# Patient Record
Sex: Male | Born: 1940 | Race: White | Hispanic: No | Marital: Married | Smoking: Never smoker
Health system: Southern US, Community
[De-identification: ages and names within clinical notes are randomized; demographics above are authoritative.]

---

## 1998-02-24 ENCOUNTER — Other Ambulatory Visit: Admission: RE | Admit: 1998-02-24 | Discharge: 1998-02-24 | Payer: Self-pay | Admitting: Family Medicine

## 1998-10-06 ENCOUNTER — Ambulatory Visit (HOSPITAL_COMMUNITY): Admission: RE | Admit: 1998-10-06 | Discharge: 1998-10-06 | Payer: Self-pay | Admitting: Family Medicine

## 1998-10-06 ENCOUNTER — Encounter: Payer: Self-pay | Admitting: Family Medicine

## 2000-03-14 ENCOUNTER — Inpatient Hospital Stay (HOSPITAL_COMMUNITY): Admission: EM | Admit: 2000-03-14 | Discharge: 2000-03-17 | Payer: Self-pay | Admitting: Emergency Medicine

## 2000-03-14 ENCOUNTER — Encounter: Payer: Self-pay | Admitting: Emergency Medicine

## 2000-03-16 ENCOUNTER — Encounter: Payer: Self-pay | Admitting: Sports Medicine

## 2000-03-22 ENCOUNTER — Encounter: Admission: RE | Admit: 2000-03-22 | Discharge: 2000-03-22 | Payer: Self-pay | Admitting: Sports Medicine

## 2001-01-11 ENCOUNTER — Encounter: Payer: Self-pay | Admitting: Emergency Medicine

## 2001-01-11 ENCOUNTER — Emergency Department (HOSPITAL_COMMUNITY): Admission: EM | Admit: 2001-01-11 | Discharge: 2001-01-11 | Payer: Self-pay | Admitting: Emergency Medicine

## 2001-10-30 ENCOUNTER — Ambulatory Visit (HOSPITAL_COMMUNITY): Admission: RE | Admit: 2001-10-30 | Discharge: 2001-10-30 | Payer: Self-pay | Admitting: Gastroenterology

## 2002-05-17 ENCOUNTER — Encounter: Admission: RE | Admit: 2002-05-17 | Discharge: 2002-05-17 | Payer: Self-pay | Admitting: Family Medicine

## 2002-05-17 ENCOUNTER — Encounter: Payer: Self-pay | Admitting: Family Medicine

## 2003-08-06 ENCOUNTER — Ambulatory Visit (HOSPITAL_COMMUNITY): Admission: RE | Admit: 2003-08-06 | Discharge: 2003-08-06 | Payer: Self-pay

## 2005-05-10 ENCOUNTER — Ambulatory Visit: Payer: Self-pay | Admitting: Cardiology

## 2005-05-14 ENCOUNTER — Ambulatory Visit: Payer: Self-pay

## 2005-05-25 ENCOUNTER — Ambulatory Visit: Payer: Self-pay | Admitting: Cardiology

## 2005-06-02 ENCOUNTER — Encounter: Admission: RE | Admit: 2005-06-02 | Discharge: 2005-06-02 | Payer: Self-pay | Admitting: Otolaryngology

## 2005-11-11 ENCOUNTER — Ambulatory Visit (HOSPITAL_COMMUNITY): Admission: RE | Admit: 2005-11-11 | Discharge: 2005-11-11 | Payer: Self-pay | Admitting: Otolaryngology

## 2006-12-01 ENCOUNTER — Ambulatory Visit: Payer: Self-pay | Admitting: Cardiology

## 2006-12-06 ENCOUNTER — Ambulatory Visit: Payer: Self-pay

## 2006-12-06 ENCOUNTER — Encounter: Payer: Self-pay | Admitting: Cardiology

## 2008-05-27 ENCOUNTER — Encounter: Admission: RE | Admit: 2008-05-27 | Discharge: 2008-05-27 | Payer: Self-pay | Admitting: Rheumatology

## 2008-06-07 ENCOUNTER — Ambulatory Visit: Payer: Self-pay | Admitting: Oncology

## 2008-06-12 LAB — CBC WITH DIFFERENTIAL/PLATELET
Basophils Absolute: 0 10*3/uL (ref 0.0–0.1)
EOS%: 17.9 % — ABNORMAL HIGH (ref 0.0–7.0)
HCT: 45.3 % (ref 38.7–49.9)
HGB: 15.7 g/dL (ref 13.0–17.1)
LYMPH%: 16.6 % (ref 14.0–48.0)
MCH: 31.5 pg (ref 28.0–33.4)
MCV: 91.1 fL (ref 81.6–98.0)
MONO#: 0.6 10*3/uL (ref 0.1–0.9)
MONO%: 7.2 % (ref 0.0–13.0)
NEUT%: 57.9 % (ref 40.0–75.0)
Platelets: 145 10*3/uL (ref 145–400)
RBC: 4.98 10*6/uL (ref 4.20–5.71)
RDW: 14.6 % (ref 11.2–14.6)

## 2008-06-12 LAB — LACTATE DEHYDROGENASE: LDH: 216 U/L (ref 94–250)

## 2008-06-12 LAB — COMPREHENSIVE METABOLIC PANEL
ALT: 15 U/L (ref 0–53)
Albumin: 4.2 g/dL (ref 3.5–5.2)
Alkaline Phosphatase: 51 U/L (ref 39–117)
Calcium: 9 mg/dL (ref 8.4–10.5)
Creatinine, Ser: 0.81 mg/dL (ref 0.40–1.50)
Sodium: 139 mEq/L (ref 135–145)
Total Bilirubin: 0.6 mg/dL (ref 0.3–1.2)
Total Protein: 7.3 g/dL (ref 6.0–8.3)

## 2008-09-05 ENCOUNTER — Ambulatory Visit: Payer: Self-pay | Admitting: Oncology

## 2008-09-09 ENCOUNTER — Ambulatory Visit (HOSPITAL_COMMUNITY): Admission: RE | Admit: 2008-09-09 | Discharge: 2008-09-09 | Payer: Self-pay | Admitting: Oncology

## 2008-09-09 LAB — CBC WITH DIFFERENTIAL/PLATELET
BASO%: 0.5 % (ref 0.0–2.0)
LYMPH%: 15.2 % (ref 14.0–48.0)
MCV: 92.9 fL (ref 81.6–98.0)
MONO%: 6.3 % (ref 0.0–13.0)
RBC: 5.05 10*6/uL (ref 4.20–5.71)
RDW: 14.7 % — ABNORMAL HIGH (ref 11.2–14.6)
lymph#: 1.2 10*3/uL (ref 0.9–3.3)

## 2008-09-09 LAB — COMPREHENSIVE METABOLIC PANEL
ALT: 22 U/L (ref 0–53)
AST: 22 U/L (ref 0–37)
Albumin: 3.9 g/dL (ref 3.5–5.2)
Alkaline Phosphatase: 54 U/L (ref 39–117)
Calcium: 9 mg/dL (ref 8.4–10.5)
Chloride: 105 mEq/L (ref 96–112)
Glucose, Bld: 102 mg/dL — ABNORMAL HIGH (ref 70–99)
Sodium: 140 mEq/L (ref 135–145)
Total Bilirubin: 0.9 mg/dL (ref 0.3–1.2)
Total Protein: 7.4 g/dL (ref 6.0–8.3)

## 2008-11-28 ENCOUNTER — Ambulatory Visit: Payer: Self-pay | Admitting: Cardiology

## 2008-12-09 ENCOUNTER — Ambulatory Visit: Payer: Self-pay

## 2008-12-09 ENCOUNTER — Encounter: Payer: Self-pay | Admitting: Cardiology

## 2009-03-06 ENCOUNTER — Ambulatory Visit: Payer: Self-pay | Admitting: Oncology

## 2009-03-10 ENCOUNTER — Ambulatory Visit (HOSPITAL_COMMUNITY): Admission: RE | Admit: 2009-03-10 | Discharge: 2009-03-10 | Payer: Self-pay | Admitting: Oncology

## 2009-03-11 ENCOUNTER — Encounter (HOSPITAL_COMMUNITY): Admission: RE | Admit: 2009-03-11 | Discharge: 2009-05-07 | Payer: Self-pay | Admitting: Rheumatology

## 2009-03-13 ENCOUNTER — Encounter: Payer: Self-pay | Admitting: Cardiology

## 2009-09-10 ENCOUNTER — Ambulatory Visit: Payer: Self-pay | Admitting: Oncology

## 2009-09-24 ENCOUNTER — Encounter (INDEPENDENT_AMBULATORY_CARE_PROVIDER_SITE_OTHER): Payer: Self-pay | Admitting: *Deleted

## 2009-11-01 IMAGING — PT NM PET TUM IMG SKULL BASE T - THIGH
8 series · 25 of 25 positions shown · non-contrast
Comparison: CTs of the chest, abdomen and pelvis 05/27/2008.

CLINICAL DATA: Staging lymphoma.

NUCLEAR MEDICINE FDG PET CT TUMOR IMAGING- (SKULL BASE THROUGH
THIGHS)
TECHNIQUE: 17.4 mCi F-18 FDG was injected intravenously via the
right wrist.  Full-ring PET imaging was performed from the skull
base through the mid-thighs 61  minutes after injection.  CT data
was obtained and used for attenuation correction and anatomic
localization only.  (This was not acquired as a diagnostic CT
examination.)
Fasting Blood Glucose:  108.

[Series 1: pet ac · axial · 3.3mm · 4.69mm/px · z∈[-870,+0]mm · 5 of 267 slices shown]
[im 1/267]
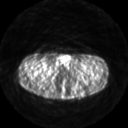
[im 67/267]
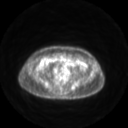
[im 134/267]
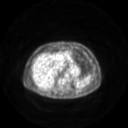
[im 200/267]
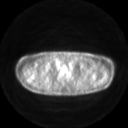
[im 267/267]
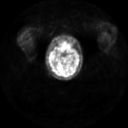

[Series 2: pet nac · axial · 3.3mm · 4.69mm/px · z∈[-870,+0]mm · 5 of 267 slices shown]
[im 1/267]
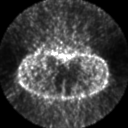
[im 67/267]
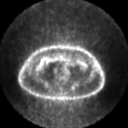
[im 134/267]
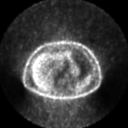
[im 200/267]
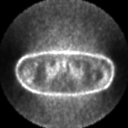
[im 267/267]
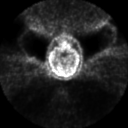

[Series 2: ct images · axial · 3.8mm · 0.98mm/px · z∈[-870,+0]mm · 5 of 267 slices shown]
[im 1/267]
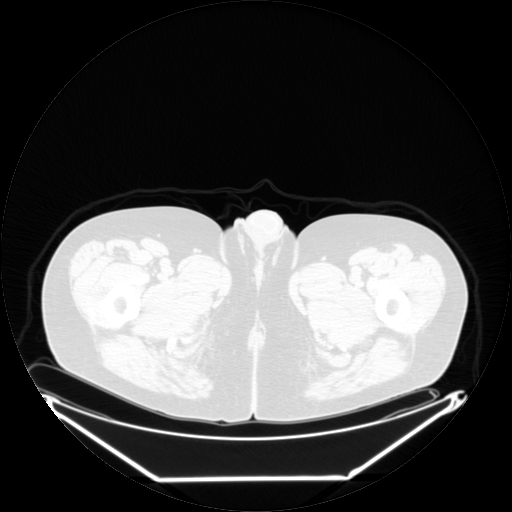
[im 67/267]
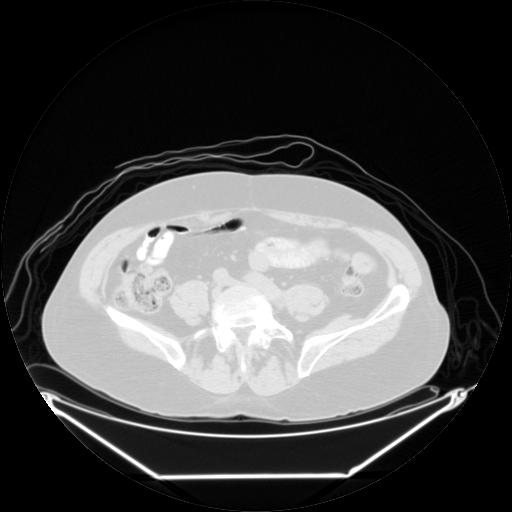
[im 134/267]
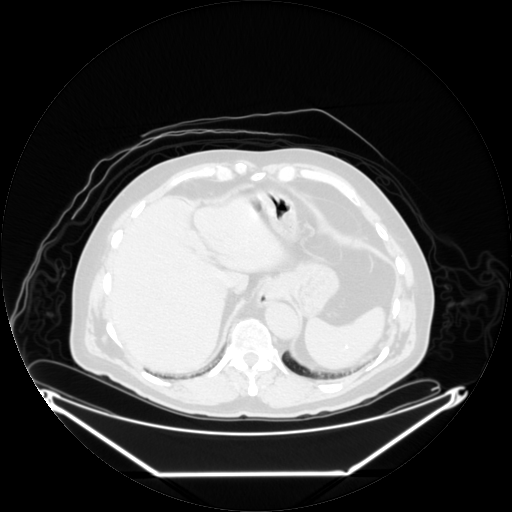
[im 200/267]
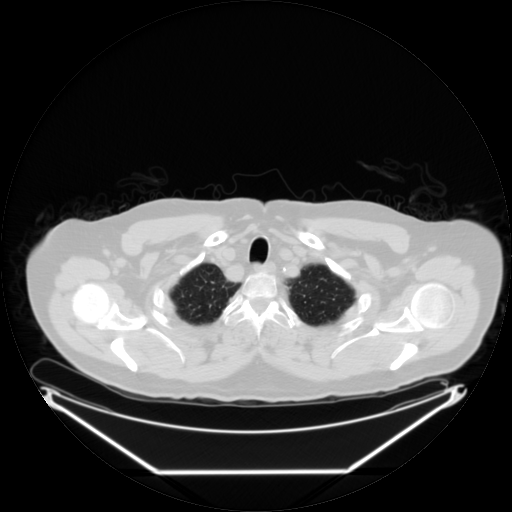
[im 267/267  brain]
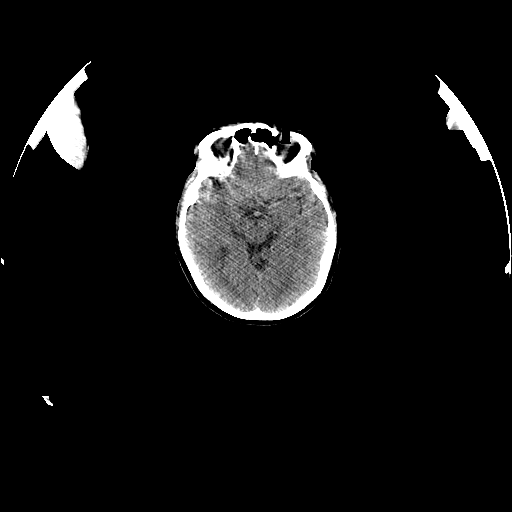

[Series 123: mip · coronal · 3.3mm · 4.69mm/px · 1 of 30 slices shown]
[im 1/30]
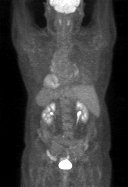

[Series 150: reformatted · axial · 3.3mm · 1.02mm/px · 1 of 8 slices shown (1 of 4)]
[im 1/8]
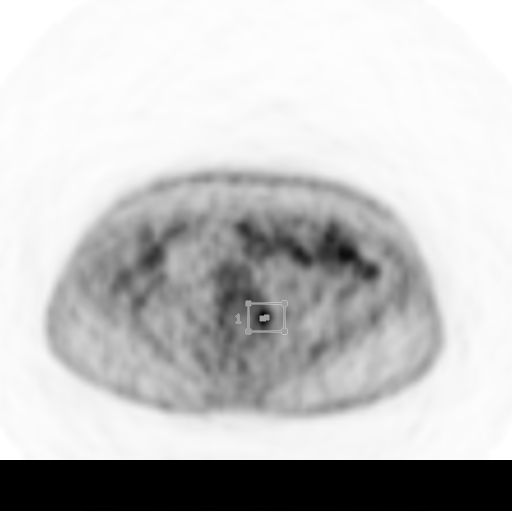

[Series 151: reformatted · axial · 3.3mm · 3.91mm/px · z∈[-870,+0]mm · 5 of 265 slices shown (2 of 4)]
[im 1/265]
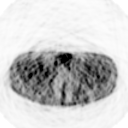
[im 67/265]
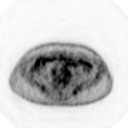
[im 133/265]
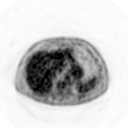
[im 199/265]
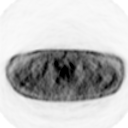
[im 265/265]
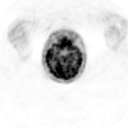

[Series 153: reformatted · coronal · 4.7mm · 6.98mm/px · 2 of 73 slices shown (3 of 4)]
[im 1/73]
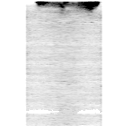
[im 73/73]
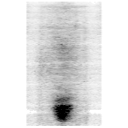

[Series 250: reformatted · axial · 3.8mm · 0.98mm/px · 1 of 4 slices shown (4 of 4)]
[im 1/4]
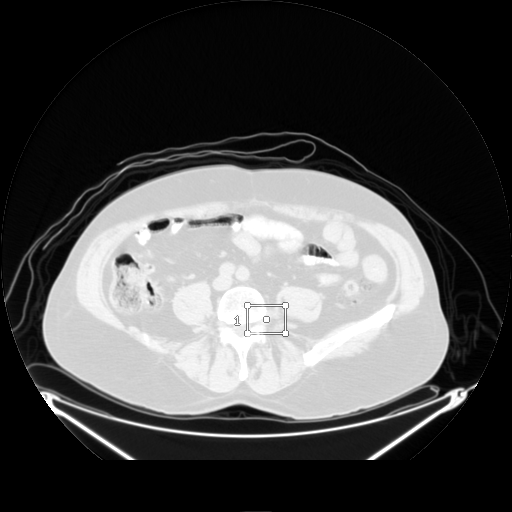

[25 of 25 positions shown; findings below may reference images not displayed]

FINDINGS: There is prominent symmetric palatine tonsil metabolic
activity bilaterally.  This has a maximal SUV of 5.3.  On the CT
images, there are small calcifications within the palatine tonsils
bilaterally.  No dominant soft tissue mass is identified.  No other
areas of hypermetabolic activity are seen within the pharyngeal
mucosal space.  There is no hypermetabolic nodal activity in the
neck.

There is no hypermetabolic activity within the precarinal lymph
node described on the prior examination.  This has a fatty hilum on
today's CT images.  The small hilar lymph nodes bilaterally are
mildly hypermetabolic with a maximal SUV of 4.2 on the left and
on the right.  There is no abnormal pulmonary parenchymal activity.

No hypermetabolic nodal activity is identified within the abdomen
or pelvis.  However, there is focal left paraspinal activity within
the left foramen at L4-L5.  This has a maximal SUV of 3.7.  This
corresponds with soft tissue in the foramen which appears new
compared with the prior CT.  This finding suggests a possible disc
extrusion with irritation of the adjacent nerve root accounting for
the hypermetabolic activity.  Atypical lymphoma cannot be excluded.

There is no hypermetabolic activity within the liver or spleen.
The CT images again demonstrate multiple calcified granulomas in
the liver and spleen and bilateral renal cysts.  The biapical lung
scarring appears stable.
IMPRESSION: 1.  Palatine tonsil activity is bilaterally symmetric and may be
related to chronic inflammation.  Correlate clinically.  There is
no hypermetabolic cervical adenopathy.
2.  Low-level activity in mildly prominent hilar lymph nodes
bilaterally is nonspecific and may be post inflammatory or related
to the patient's lymphoma.  There is no hypermetabolic mediastinal
or abdominal lymphadenopathy.
3.  Small focus of hypermetabolic activity in the left L4-L5
foramen may reflect a disc extrusion with left L4 nerve root
irritation.  Left paraspinal tumor is felt to be unlikely, but not
completely excluded.  Does the patient have a left L4
radiculopathy?  This finding would be best evaluated with lumbar
MRI to include postcontrast images.

## 2011-02-16 LAB — DIFFERENTIAL
Basophils Relative: 0 % (ref 0–1)
Eosinophils Absolute: 0.5 10*3/uL (ref 0.0–0.7)
Neutro Abs: 10.5 10*3/uL — ABNORMAL HIGH (ref 1.7–7.7)

## 2011-02-16 LAB — COMPREHENSIVE METABOLIC PANEL
ALT: 17 U/L (ref 0–53)
AST: 20 U/L (ref 0–37)
Alkaline Phosphatase: 48 U/L (ref 39–117)
Calcium: 8.8 mg/dL (ref 8.4–10.5)
Creatinine, Ser: 0.96 mg/dL (ref 0.4–1.5)
Glucose, Bld: 167 mg/dL — ABNORMAL HIGH (ref 70–99)
Total Bilirubin: 0.8 mg/dL (ref 0.3–1.2)

## 2011-02-16 LAB — CBC
HCT: 44.4 % (ref 39.0–52.0)
Hemoglobin: 14.8 g/dL (ref 13.0–17.0)
MCHC: 33.4 g/dL (ref 30.0–36.0)
MCV: 93 fL (ref 78.0–100.0)
RDW: 13.9 % (ref 11.5–15.5)
WBC: 12.2 10*3/uL — ABNORMAL HIGH (ref 4.0–10.5)

## 2011-03-23 NOTE — Assessment & Plan Note (Signed)
[facility]                            CARDIOLOGY OFFICE NOTE   ROSCOE, WITTS                        MRN:          [id number]  DATE:11/28/2008                            DOB:          12-22-1940    Mr. Lindholm is seen for cardiac followup.  He has not been having any  chest pain or shortness of breath.  He goes about full activities.  He  does have some mild AI and mild dilatation of the aortic root and a 2-D  echo was done in January 2008 showing that these were stable.  He  follows very carefully with Dr. Kellie Simmering.   PAST MEDICAL HISTORY:   ALLERGIES:  No known drug allergies.   MEDICATIONS:  Norvasc 10, lisinopril, hydrochlorothiazide, aspirin,  Nexium, methotrexate, Paxil, Plaquenil, and prednisone.   OTHER MEDICAL PROBLEMS:  See the list below.   REVIEW OF SYSTEMS:  The patient mentions that he had some type of  abnormalities seen on the chest x-ray that [facility] by Dr. Kellie Simmering.  He says he is to have followup in May.  I do not have any further specifics.  The patient is not having any GI  or GU symptoms.  He is not having any headaches or fevers, chills, or  rashes.  Otherwise, his review of systems is negative.   PHYSICAL EXAMINATION:  VITAL SIGNS:  Blood pressure 158/94 with a pulse  of 64.  GENERAL:  The patient is oriented to person, time, and place.  Affect is  normal.  HEENT:  No xanthelasma.  He has normal extraocular motion.  NECK:  There are no carotid bruits.  There is no jugular venous  distention.  LUNGS:  Clear.  Respiratory effort is not labored.  CARDIAC:  An S1 with an S2.  There are no clicks or significant murmurs.  ABDOMEN:  Soft.  EXTREMITIES:  There is no peripheral edema.  I do not hear his AI or MR  today.   EKG today shows sinus rhythm with no significant changes.  He has  nonspecific ST-T-wave changes.   PROBLEMS:  1. Status post appendectomy and surgery for perforated  diverticulum in      1998.  2. Significant rheumatoid arthritis treated long-term by Dr. Kellie Simmering.  3. History of some depression, stable.  4. Benign prostatic hypertrophy.  5. Gastroesophageal reflux disease.  6. Hypertension.  His blood pressure is elevated today.  I have      reviewed this with him.  He tells me that at Dr. Ines Bloomer office,      he has been under control on a regular basis.  We will not change      his meds he is currently on meds.  Dr. Kellie Simmering will be checking his      pressures further.  7. Minimal coronary disease by cath in 2001.  8. Normal left ventricular function.  9. History of some shortness of breath over time.  10.Abnormality found on a chest x-ray that is being followed through      [facility]  [facility].  11.Mild aortic insufficiency.  12.Mild mitral regurgitation.  13.Mild dilatation of the aortic root.  It is now a 2-year interval      and we need to follow up 2-D echo to assess this further.  14.History of incomplete right bundle-branch block at times on his      EKG.   The patient is stable.  He does need a followup echo.  This is being  arranged.  I will be in touch with him about the echo after we obtain  it.  We will see him back in a year unless there is a change on his  echo.     Luis Abed, MD, [facility]  Electronically Signed    JDK/MedQ  DD: 11/28/2008  DT: 11/29/2008  Job #: [id number]   cc:   Aundra Dubin, M.D.

## 2011-08-10 LAB — GLUCOSE, CAPILLARY: Glucose-Capillary: 108 — ABNORMAL HIGH

## 2016-06-02 DIAGNOSIS — D696 Thrombocytopenia, unspecified: Secondary | ICD-10-CM

## 2016-12-27 DIAGNOSIS — M069 Rheumatoid arthritis, unspecified: Secondary | ICD-10-CM | POA: Diagnosis not present

## 2016-12-27 DIAGNOSIS — D721 Eosinophilia: Secondary | ICD-10-CM | POA: Diagnosis not present

## 2016-12-27 DIAGNOSIS — D696 Thrombocytopenia, unspecified: Secondary | ICD-10-CM | POA: Diagnosis not present

## 2018-03-23 DIAGNOSIS — M069 Rheumatoid arthritis, unspecified: Secondary | ICD-10-CM | POA: Diagnosis not present

## 2018-03-23 DIAGNOSIS — I10 Essential (primary) hypertension: Secondary | ICD-10-CM | POA: Diagnosis not present

## 2018-03-23 DIAGNOSIS — Z139 Encounter for screening, unspecified: Secondary | ICD-10-CM | POA: Diagnosis not present

## 2018-03-23 DIAGNOSIS — R9431 Abnormal electrocardiogram [ECG] [EKG]: Secondary | ICD-10-CM | POA: Diagnosis not present

## 2018-04-04 DIAGNOSIS — M0579 Rheumatoid arthritis with rheumatoid factor of multiple sites without organ or systems involvement: Secondary | ICD-10-CM | POA: Diagnosis not present

## 2018-04-04 DIAGNOSIS — Z79899 Other long term (current) drug therapy: Secondary | ICD-10-CM | POA: Diagnosis not present

## 2018-05-08 DIAGNOSIS — M720 Palmar fascial fibromatosis [Dupuytren]: Secondary | ICD-10-CM | POA: Diagnosis not present

## 2018-05-08 DIAGNOSIS — Z79899 Other long term (current) drug therapy: Secondary | ICD-10-CM | POA: Diagnosis not present

## 2018-05-08 DIAGNOSIS — M0579 Rheumatoid arthritis with rheumatoid factor of multiple sites without organ or systems involvement: Secondary | ICD-10-CM | POA: Diagnosis not present

## 2018-05-08 DIAGNOSIS — N343 Urethral syndrome, unspecified: Secondary | ICD-10-CM | POA: Diagnosis not present

## 2018-05-29 DIAGNOSIS — N481 Balanitis: Secondary | ICD-10-CM | POA: Diagnosis not present

## 2018-05-29 DIAGNOSIS — N471 Phimosis: Secondary | ICD-10-CM | POA: Diagnosis not present

## 2018-05-29 DIAGNOSIS — Z6823 Body mass index (BMI) 23.0-23.9, adult: Secondary | ICD-10-CM | POA: Diagnosis not present

## 2018-05-29 DIAGNOSIS — R7303 Prediabetes: Secondary | ICD-10-CM | POA: Diagnosis not present

## 2018-07-24 DIAGNOSIS — M720 Palmar fascial fibromatosis [Dupuytren]: Secondary | ICD-10-CM | POA: Diagnosis not present

## 2018-07-28 DIAGNOSIS — I10 Essential (primary) hypertension: Secondary | ICD-10-CM | POA: Diagnosis not present

## 2018-07-28 DIAGNOSIS — D696 Thrombocytopenia, unspecified: Secondary | ICD-10-CM | POA: Diagnosis not present

## 2018-07-28 DIAGNOSIS — Z9181 History of falling: Secondary | ICD-10-CM | POA: Diagnosis not present

## 2018-07-28 DIAGNOSIS — I2510 Atherosclerotic heart disease of native coronary artery without angina pectoris: Secondary | ICD-10-CM | POA: Diagnosis not present

## 2018-08-31 ENCOUNTER — Other Ambulatory Visit: Payer: Self-pay

## 2018-08-31 NOTE — Patient Outreach (Signed)
[facility]) Care Management  08/31/2018  Jackson Lawson February 26, 1941 [id number]   Medication Adherence call to [name] left a message for patient to call back patient is due on Lisinopril / HCTZ 20/25 mg. [name] is showing past due under [employer].   Lillia Abed CPhT Pharmacy Technician [facility] Care Management Direct Dial [phone]  Fax [phone] [email or web address]

## 2019-01-17 DIAGNOSIS — Z79899 Other long term (current) drug therapy: Secondary | ICD-10-CM | POA: Diagnosis not present

## 2019-01-17 DIAGNOSIS — M720 Palmar fascial fibromatosis [Dupuytren]: Secondary | ICD-10-CM | POA: Diagnosis not present

## 2019-01-17 DIAGNOSIS — M0579 Rheumatoid arthritis with rheumatoid factor of multiple sites without organ or systems involvement: Secondary | ICD-10-CM | POA: Diagnosis not present

## 2019-01-17 DIAGNOSIS — D696 Thrombocytopenia, unspecified: Secondary | ICD-10-CM | POA: Diagnosis not present

## 2019-01-17 DIAGNOSIS — M059 Rheumatoid arthritis with rheumatoid factor, unspecified: Secondary | ICD-10-CM | POA: Diagnosis not present

## 2019-02-02 DIAGNOSIS — I2510 Atherosclerotic heart disease of native coronary artery without angina pectoris: Secondary | ICD-10-CM | POA: Diagnosis not present

## 2019-02-02 DIAGNOSIS — M069 Rheumatoid arthritis, unspecified: Secondary | ICD-10-CM | POA: Diagnosis not present

## 2019-02-02 DIAGNOSIS — I10 Essential (primary) hypertension: Secondary | ICD-10-CM | POA: Diagnosis not present

## 2019-02-02 DIAGNOSIS — D696 Thrombocytopenia, unspecified: Secondary | ICD-10-CM | POA: Diagnosis not present

## 2019-04-19 DIAGNOSIS — M0579 Rheumatoid arthritis with rheumatoid factor of multiple sites without organ or systems involvement: Secondary | ICD-10-CM | POA: Diagnosis not present

## 2019-04-19 DIAGNOSIS — Z79899 Other long term (current) drug therapy: Secondary | ICD-10-CM | POA: Diagnosis not present

## 2019-06-12 DIAGNOSIS — I10 Essential (primary) hypertension: Secondary | ICD-10-CM | POA: Diagnosis not present

## 2019-06-12 DIAGNOSIS — I2510 Atherosclerotic heart disease of native coronary artery without angina pectoris: Secondary | ICD-10-CM | POA: Diagnosis not present

## 2019-06-12 DIAGNOSIS — Z139 Encounter for screening, unspecified: Secondary | ICD-10-CM | POA: Diagnosis not present

## 2019-06-12 DIAGNOSIS — D696 Thrombocytopenia, unspecified: Secondary | ICD-10-CM | POA: Diagnosis not present

## 2019-06-12 DIAGNOSIS — M720 Palmar fascial fibromatosis [Dupuytren]: Secondary | ICD-10-CM | POA: Diagnosis not present

## 2019-06-25 DIAGNOSIS — Z0000 Encounter for general adult medical examination without abnormal findings: Secondary | ICD-10-CM | POA: Diagnosis not present

## 2019-06-25 DIAGNOSIS — E785 Hyperlipidemia, unspecified: Secondary | ICD-10-CM | POA: Diagnosis not present

## 2019-06-25 DIAGNOSIS — Z9181 History of falling: Secondary | ICD-10-CM | POA: Diagnosis not present

## 2019-07-20 DIAGNOSIS — Z79899 Other long term (current) drug therapy: Secondary | ICD-10-CM | POA: Diagnosis not present

## 2019-07-20 DIAGNOSIS — M0579 Rheumatoid arthritis with rheumatoid factor of multiple sites without organ or systems involvement: Secondary | ICD-10-CM | POA: Diagnosis not present

## 2019-07-20 DIAGNOSIS — M720 Palmar fascial fibromatosis [Dupuytren]: Secondary | ICD-10-CM | POA: Diagnosis not present

## 2019-08-13 DIAGNOSIS — Z23 Encounter for immunization: Secondary | ICD-10-CM | POA: Diagnosis not present

## 2019-08-13 DIAGNOSIS — R05 Cough: Secondary | ICD-10-CM | POA: Diagnosis not present

## 2019-08-13 DIAGNOSIS — I10 Essential (primary) hypertension: Secondary | ICD-10-CM | POA: Diagnosis not present

## 2019-08-13 DIAGNOSIS — Z139 Encounter for screening, unspecified: Secondary | ICD-10-CM | POA: Diagnosis not present

## 2019-08-13 DIAGNOSIS — K219 Gastro-esophageal reflux disease without esophagitis: Secondary | ICD-10-CM | POA: Diagnosis not present

## 2019-08-13 DIAGNOSIS — I2510 Atherosclerotic heart disease of native coronary artery without angina pectoris: Secondary | ICD-10-CM | POA: Diagnosis not present

## 2019-08-15 DIAGNOSIS — Z6822 Body mass index (BMI) 22.0-22.9, adult: Secondary | ICD-10-CM | POA: Diagnosis not present

## 2019-08-15 DIAGNOSIS — I959 Hypotension, unspecified: Secondary | ICD-10-CM | POA: Diagnosis not present

## 2019-08-15 DIAGNOSIS — N390 Urinary tract infection, site not specified: Secondary | ICD-10-CM | POA: Diagnosis not present

## 2019-08-15 DIAGNOSIS — I10 Essential (primary) hypertension: Secondary | ICD-10-CM | POA: Diagnosis not present

## 2019-08-15 DIAGNOSIS — I2510 Atherosclerotic heart disease of native coronary artery without angina pectoris: Secondary | ICD-10-CM | POA: Diagnosis not present

## 2019-08-16 DIAGNOSIS — N390 Urinary tract infection, site not specified: Secondary | ICD-10-CM | POA: Diagnosis not present

## 2019-08-22 DIAGNOSIS — I2510 Atherosclerotic heart disease of native coronary artery without angina pectoris: Secondary | ICD-10-CM | POA: Diagnosis not present

## 2019-08-22 DIAGNOSIS — N390 Urinary tract infection, site not specified: Secondary | ICD-10-CM | POA: Diagnosis not present

## 2019-08-22 DIAGNOSIS — R001 Bradycardia, unspecified: Secondary | ICD-10-CM | POA: Diagnosis not present

## 2019-08-22 DIAGNOSIS — R05 Cough: Secondary | ICD-10-CM | POA: Diagnosis not present

## 2019-08-22 DIAGNOSIS — I10 Essential (primary) hypertension: Secondary | ICD-10-CM | POA: Diagnosis not present

## 2019-09-05 DIAGNOSIS — D72829 Elevated white blood cell count, unspecified: Secondary | ICD-10-CM | POA: Diagnosis not present

## 2019-09-05 DIAGNOSIS — R001 Bradycardia, unspecified: Secondary | ICD-10-CM | POA: Diagnosis not present

## 2019-09-05 DIAGNOSIS — N289 Disorder of kidney and ureter, unspecified: Secondary | ICD-10-CM | POA: Diagnosis not present

## 2019-09-05 DIAGNOSIS — R05 Cough: Secondary | ICD-10-CM | POA: Diagnosis not present

## 2019-09-05 DIAGNOSIS — I10 Essential (primary) hypertension: Secondary | ICD-10-CM | POA: Diagnosis not present

## 2019-09-12 DIAGNOSIS — Z6821 Body mass index (BMI) 21.0-21.9, adult: Secondary | ICD-10-CM | POA: Diagnosis not present

## 2019-09-12 DIAGNOSIS — N390 Urinary tract infection, site not specified: Secondary | ICD-10-CM | POA: Diagnosis not present

## 2019-09-12 DIAGNOSIS — I959 Hypotension, unspecified: Secondary | ICD-10-CM | POA: Diagnosis not present

## 2019-09-12 DIAGNOSIS — I4891 Unspecified atrial fibrillation: Secondary | ICD-10-CM | POA: Diagnosis not present

## 2019-09-14 DIAGNOSIS — N390 Urinary tract infection, site not specified: Secondary | ICD-10-CM | POA: Diagnosis not present

## 2019-09-14 DIAGNOSIS — I10 Essential (primary) hypertension: Secondary | ICD-10-CM | POA: Diagnosis not present

## 2019-09-14 DIAGNOSIS — Z6822 Body mass index (BMI) 22.0-22.9, adult: Secondary | ICD-10-CM | POA: Diagnosis not present

## 2019-09-14 DIAGNOSIS — I4891 Unspecified atrial fibrillation: Secondary | ICD-10-CM | POA: Diagnosis not present

## 2019-09-19 DIAGNOSIS — N390 Urinary tract infection, site not specified: Secondary | ICD-10-CM | POA: Diagnosis not present

## 2019-09-19 DIAGNOSIS — Z6822 Body mass index (BMI) 22.0-22.9, adult: Secondary | ICD-10-CM | POA: Diagnosis not present

## 2019-09-19 DIAGNOSIS — I4891 Unspecified atrial fibrillation: Secondary | ICD-10-CM | POA: Diagnosis not present

## 2019-09-19 DIAGNOSIS — I10 Essential (primary) hypertension: Secondary | ICD-10-CM | POA: Diagnosis not present

## 2019-09-27 ENCOUNTER — Telehealth: Payer: Self-pay | Admitting: Cardiovascular Disease

## 2019-09-27 NOTE — Telephone Encounter (Signed)
New message:    Patient states he would like for his wife to be with him for appt she has to drive him and assist. Please call back.

## 2019-09-28 NOTE — Telephone Encounter (Signed)
Called and spoke to pt to let him know his wife can accompany him to appt 11/24. Verbalized understanding.

## 2019-10-02 ENCOUNTER — Other Ambulatory Visit: Payer: Self-pay

## 2019-10-02 ENCOUNTER — Encounter: Payer: Self-pay | Admitting: Cardiovascular Disease

## 2019-10-02 ENCOUNTER — Ambulatory Visit: Payer: Medicare Other | Admitting: Cardiovascular Disease

## 2019-10-02 VITALS — BP 114/75 | HR 75 | Temp 96.9°F | Ht 73.0 in | Wt 167.0 lb

## 2019-10-02 DIAGNOSIS — E785 Hyperlipidemia, unspecified: Secondary | ICD-10-CM | POA: Insufficient documentation

## 2019-10-02 DIAGNOSIS — I4891 Unspecified atrial fibrillation: Secondary | ICD-10-CM

## 2019-10-02 DIAGNOSIS — I351 Nonrheumatic aortic (valve) insufficiency: Secondary | ICD-10-CM | POA: Diagnosis not present

## 2019-10-02 DIAGNOSIS — I2510 Atherosclerotic heart disease of native coronary artery without angina pectoris: Secondary | ICD-10-CM

## 2019-10-02 DIAGNOSIS — I10 Essential (primary) hypertension: Secondary | ICD-10-CM | POA: Insufficient documentation

## 2019-10-02 DIAGNOSIS — I340 Nonrheumatic mitral (valve) insufficiency: Secondary | ICD-10-CM | POA: Insufficient documentation

## 2019-10-02 NOTE — Assessment & Plan Note (Signed)
History of hyperlipidemia not on statin therapy with lipid profile performed 01/28/2017 revealing total cholesterol of 186 with an LDL of 107.

## 2019-10-02 NOTE — Progress Notes (Signed)
10/02/2019 Jackson Lawson   1940-12-23  [id number]  Primary Physician Cyndi Bender, PA-C Primary Cardiologist: Lorretta Harp MD Lupe Carney, [place]  HPI:  Jackson Lawson is a 78 y.o. thin appearing married Caucasian male father of 3 daughters, grandfather of 6 grandchildren is accompanied by his wife Marcie Bal today. He was referred by Cyndi Bender, PA-C for evaluation of A. fib. He has seen Dr. Dola Argyle remotely in the 90s. He is retired from being a Brewing technologist. His risk factors include treated hypertension. There is a question of a heart attack back in 1995 with a heart cath performed 03/17/2000 that showed minimal CAD. He also has history of aortic insufficiency, mitral regurgitation and aortic root dilatation. He gets occasional atypical chest pain. He was seen by Cyndi Bender and on in the office 11/6 and found to be in A. fib and placed on Eliquis oral anticoagulation.   Current Meds  Medication Sig  . amLODipine (NORVASC) 10 MG tablet Take 10 mg by mouth daily.  [name] apixaban (ELIQUIS) 2.5 MG TABS tablet Take 2.5 mg by mouth 2 (two) times daily.  . Ascorbic Acid (VITAMIN C) 100 MG tablet Take by mouth.  [name] aspirin EC 81 MG tablet Take by mouth.  . Cholecalciferol (VITAMIN D-1000 MAX ST) 25 MCG (1000 UT) tablet Take by mouth.  . ciprofloxacin (CIPRO) 500 MG tablet Take 500 mg by mouth 2 (two) times daily.  [name] FLUoxetine (PROZAC) 20 MG capsule Take 20 capsules by mouth daily.  [name] leflunomide (ARAVA) 20 MG tablet Take 20 mg by mouth daily.  [name] losartan-hydrochlorothiazide (HYZAAR) 100-25 MG tablet Take by mouth.  . metoprolol tartrate (LOPRESSOR) 50 MG tablet metoprolol tartrate 50 mg tablet  . naproxen sodium (ALEVE) 220 MG tablet Take by mouth.  [name] omeprazole (PRILOSEC) 40 MG capsule Take 40 mg by mouth daily.     No Known Allergies  Social History   Socioeconomic History  . Marital status: Married    Spouse name: Not on file  . Number of children: Not on file  .  Years of education: Not on file  . Highest education level: Not on file  Occupational History  . Not on file  Social Needs  . Financial resource strain: Not on file  . Food insecurity    Worry: Not on file    Inability: Not on file  . Transportation needs    Medical: Not on file    Non-medical: Not on file  Tobacco Use  . Smoking status: Never Smoker  . Smokeless tobacco: Never Used  Substance and Sexual Activity  . Alcohol use: Not on file  . Drug use: Not on file  . Sexual activity: Not on file  Lifestyle  . Physical activity    Days per week: Not on file    Minutes per session: Not on file  . Stress: Not on file  Relationships  . Social Herbalist on phone: Not on file    Gets together: Not on file    Attends religious service: Not on file    Active member of club or organization: Not on file    Attends meetings of clubs or organizations: Not on file    Relationship status: Not on file  . Intimate partner violence    Fear of current or ex partner: Not on file    Emotionally abused: Not on file    Physically abused: Not on file  Forced sexual activity: Not on file  Other Topics Concern  . Not on file  Social History Narrative  . Not on file     Review of Systems: General: negative for chills, fever, night sweats or weight changes.  Cardiovascular: negative for chest pain, dyspnea on exertion, edema, orthopnea, palpitations, paroxysmal nocturnal dyspnea or shortness of breath Dermatological: negative for rash Respiratory: negative for cough or wheezing Urologic: negative for hematuria Abdominal: negative for nausea, vomiting, diarrhea, bright red blood per rectum, melena, or hematemesis Neurologic: negative for visual changes, syncope, or dizziness All other systems reviewed and are otherwise negative except as noted above.    Blood pressure 114/75, pulse 75, temperature (!) 96.9 F (36.1 C), height 6\' 1"  (1.854 m), weight 167 lb (75.8 kg), SpO2 95  %.  General appearance: alert and no distress Neck: no adenopathy, no carotid bruit, no JVD, supple, symmetrical, trachea midline and thyroid not enlarged, symmetric, no tenderness/mass/nodules Lungs: clear to auscultation bilaterally Heart: regular rate and rhythm, S1, S2 normal, no murmur, click, rub or gallop Extremities: extremities normal, atraumatic, no cyanosis or edema Pulses: 2+ and symmetric Skin: Skin color, texture, turgor normal. No rashes or lesions Neurologic: Alert and oriented X 3, normal strength and tone. Normal symmetric reflexes. Normal coordination and gait  EKG sinus rhythm with right bundle branch block, left axis deviation and sinus arrhythmia with PACs. I personally reviewed this EKG.  ASSESSMENT AND PLAN:   Essential hypertension History of essential hypertension the blood pressure measured today at 114/75. He is on metoprolol, losartan, amlodipine and hydrochlorothiazide.  Hyperlipidemia History of hyperlipidemia not on statin therapy with lipid profile performed 01/28/2017 revealing total cholesterol of 186 with an LDL of 107.  Aortic insufficiency We will recheck a 2D echocardiogram  Mitral regurgitation We will recheck a 2D echocardiogram  Coronary artery disease Questionable history of a myocardial infarction in 1995 with cath performed 03/17/2000 that showed minimal CAD. He gets atypical chest pain  Atrial fibrillation (HCC) Recently documented on office visit dated 09/14/2019 and placed on Eliquis. His EKG today shows sinus rhythm with sinus arrhythmia, and right bundle branch block. I am going to obtain a 30-day event monitor to further evaluate whether or not he has atrial fibrillation.      13/04/2019 MD FACP,FACC,FAHA, [facility] 10/02/2019 4:41 PM

## 2019-10-02 NOTE — Assessment & Plan Note (Signed)
Recently documented on office visit dated 09/14/2019 and placed on Eliquis. His EKG today shows sinus rhythm with sinus arrhythmia, and right bundle branch block. I am going to obtain a 30-day event monitor to further evaluate whether or not he has atrial fibrillation.

## 2019-10-02 NOTE — Assessment & Plan Note (Signed)
History of essential hypertension the blood pressure measured today at 114/75. He is on metoprolol, losartan, amlodipine and hydrochlorothiazide.

## 2019-10-02 NOTE — Assessment & Plan Note (Signed)
We will recheck a 2D echocardiogram 

## 2019-10-02 NOTE — Assessment & Plan Note (Signed)
We will recheck a 2D echocardiogram

## 2019-10-02 NOTE — Assessment & Plan Note (Signed)
Questionable history of a myocardial infarction in 1995 with cath performed 03/17/2000 that showed minimal CAD. He gets atypical chest pain

## 2019-10-02 NOTE — Patient Instructions (Addendum)
Medication Instructions:  Your physician recommends that you continue on your current medications as directed. Please refer to the Current Medication list given to you today.  If you need a refill on your cardiac medications before your next appointment, please call your pharmacy.   Lab work: NONE  Testing/Procedures: Your physician has recommended that you wear an 30 day event monitor. Event monitors are medical devices that record the heart's electrical activity. Doctors most often [place] these monitors to diagnose arrhythmias. Arrhythmias are problems with the speed or rhythm of the heartbeat. The monitor is a small, portable device. You can wear one while you do your normal daily activities. This is usually used to diagnose what is causing palpitations/syncope (passing out).  AND  Your physician has requested that you have an echocardiogram. Echocardiography is a painless test that uses sound waves to create images of your heart. It provides your doctor with information about the size and shape of your heart and how well your heart's chambers and valves are working. This procedure takes approximately one hour. There are no restrictions for this procedure. [address]   Follow-Up: At [facility], you and your health needs are our priority.  As part of our continuing mission to provide you with exceptional heart care, we have created designated Provider Care Teams.  These Care Teams include your primary Cardiologist (physician) and Advanced Practice Providers (APPs -  Physician Assistants and Nurse Practitioners) who all work together to provide you with the care you need, when you need it. You may see Dr Gwenlyn Found or one of the following Advanced Practice Providers on your designated Care Team:    Kerin Ransom, PA-C  [place], [place]  Coletta Memos, [place]  Your physician wants you to follow-up in: 2 months. You will receive a reminder letter in the mail two months in  advance. If you don't receive a letter, please call our office to schedule the follow-up appointment.  Any Other Special Instructions Will Be Listed Below (If Applicable).  [employer] Cardiac Event Monitor Instructions Your physician has requested you wear your cardiac event monitor for _____ days, (1-30). [employer] may call or text to confirm a shipping address. The monitor will be sent to a land address via UPS. [employer] will not ship a monitor to a PO BOX. It typically takes 3-5 days to receive your monitor after it has been enrolled. [employer] will assist with USPS tracking if your package is delayed. The telephone number for [employer] is [phone]. Once you have received your monitor, please review the enclosed instructions. Instruction tutorials can also be viewed under help and settings on the enclosed cell phone. Your monitor has already been registered assigning a specific monitor serial # to you.  Applying the monitor Remove cell phone from case and turn it on. The cell phone works as Dealer and needs to be within [employer] of you at all times. The cell phone will need to be charged on a daily basis. We recommend you plug the cell phone into the enclosed charger at your bedside table every night.  Monitor batteries: You will receive two monitor batteries labelled #1 and #2. These are your recorders. Plug battery #2 onto the second connection on the enclosed charger. Keep one battery on the charger at all times. This will keep the monitor battery deactivated. It will also keep it fully charged for when you need to switch your monitor batteries. A small light will be blinking on the  battery emblem when it is charging. The light on the battery emblem will remain on when the battery is fully charged.  Open package of a Monitor strip. Insert battery #1 into black hood on strip and gently squeeze monitor battery onto connection as indicated in instruction booklet. Set  aside while preparing skin.  Choose location for your strip, vertical or horizontal, as indicated in the instruction booklet. Shave to remove all hair from location. There cannot be any lotions, oils, powders, or colognes on skin where monitor is to be applied. Wipe skin clean with enclosed Saline wipe. Dry skin completely.  Peel paper labeled #1 off the back of the Monitor strip exposing the adhesive. Place the monitor on the chest in the vertical or horizontal position shown in the instruction booklet. One arrow on the monitor strip must be pointing upward. Carefully remove paper labeled #2, attaching remainder of strip to your skin. Try not to create any folds or wrinkles in the strip as you apply it.  Firmly press and release the circle in the center of the monitor battery. You will hear a small beep. This is turning the monitor battery on. The heart emblem on the monitor battery will light up every 5 seconds if the monitor battery in turned on and connected to the patient securely. Do not push and hold the circle down as this turns the monitor battery off. The cell phone will locate the monitor battery. A screen will appear on the cell phone checking the connection of your monitor strip. This may read poor connection initially but change to good connection within the next minute. Once your monitor accepts the connection you will hear a series of 3 beeps followed by a climbing crescendo of beeps. A screen will appear on the cell phone showing the two monitor strip placement options. Touch the picture that demonstrates where you applied the monitor strip.  Your monitor strip and battery are waterproof. You are able to shower, bathe, or swim with the monitor on. They just ask you do not submerge deeper than 3 feet underwater. We recommend removing the monitor if you are swimming in a lake, river, or ocean.  Your monitor battery will need to be switched to a fully charged monitor battery  approximately once a week. The cell phone will alert you of an action which needs to be made.  On the cell phone, tap for details to reveal connection status, monitor battery status, and cell phone battery status. The green dots indicates your monitor is in good status. A red dot indicates there is something that needs your attention.  To record a symptom, click the circle on the monitor battery. In 30-60 seconds a list of symptoms will appear on the cell phone. Select your symptom and tap save. Your monitor will record a sustained or significant arrhythmia regardless of you clicking the button. Some patients do not feel the heart rhythm irregularities. [employer] will notify us of any serious or critical events.  Refer to instruction booklet for instructions on switching batteries, changing strips, the Do not disturb or Pause features, or any additional questions.  Call [employer] at [phone], to confirm your monitor is transmitting and record your baseline. They will answer any questions you may have regarding the monitor instructions at that time.  Returning the monitor to [employer] Place all equipment back into blue box. Peel off strip of paper to expose adhesive and close box securely. There is a prepaid UPS shipping label on this box.  Drop in a UPS drop box, or at a UPS facility like [employer]. You may also contact [employer] to arrange UPS to pick up monitor package at your home.

## 2019-10-09 ENCOUNTER — Telehealth: Payer: Self-pay | Admitting: Radiology

## 2019-10-09 NOTE — Telephone Encounter (Signed)
Enrolled patient for a 30 ay [employer] Event monitor to be mailed.

## 2019-10-13 ENCOUNTER — Encounter (INDEPENDENT_AMBULATORY_CARE_PROVIDER_SITE_OTHER): Payer: Medicare Other

## 2019-10-13 DIAGNOSIS — I340 Nonrheumatic mitral (valve) insufficiency: Secondary | ICD-10-CM | POA: Diagnosis not present

## 2019-10-13 DIAGNOSIS — I351 Nonrheumatic aortic (valve) insufficiency: Secondary | ICD-10-CM

## 2019-10-13 DIAGNOSIS — I4891 Unspecified atrial fibrillation: Secondary | ICD-10-CM

## 2019-10-15 DIAGNOSIS — Z6821 Body mass index (BMI) 21.0-21.9, adult: Secondary | ICD-10-CM | POA: Diagnosis not present

## 2019-10-15 DIAGNOSIS — I2510 Atherosclerotic heart disease of native coronary artery without angina pectoris: Secondary | ICD-10-CM | POA: Diagnosis not present

## 2019-10-15 DIAGNOSIS — I10 Essential (primary) hypertension: Secondary | ICD-10-CM | POA: Diagnosis not present

## 2019-10-15 DIAGNOSIS — I4891 Unspecified atrial fibrillation: Secondary | ICD-10-CM | POA: Diagnosis not present

## 2019-10-22 ENCOUNTER — Ambulatory Visit (HOSPITAL_COMMUNITY): Payer: Medicare Other | Attending: Cardiovascular Disease

## 2019-10-22 ENCOUNTER — Other Ambulatory Visit: Payer: Self-pay

## 2019-10-22 DIAGNOSIS — I351 Nonrheumatic aortic (valve) insufficiency: Secondary | ICD-10-CM

## 2019-10-22 DIAGNOSIS — I340 Nonrheumatic mitral (valve) insufficiency: Secondary | ICD-10-CM | POA: Diagnosis not present

## 2019-10-22 DIAGNOSIS — I4891 Unspecified atrial fibrillation: Secondary | ICD-10-CM

## 2019-11-12 ENCOUNTER — Telehealth: Payer: Self-pay | Admitting: *Deleted

## 2019-11-12 NOTE — Telephone Encounter (Signed)
Spoke with pt re critical event from [employer] from 11/10/19 2:16 AM Sinus rhythm Atrial Flutter with variable conduction w/PACs Pt was sleeping and asymptomatic Discussed with Dr Herbie Baltimore no changes at this time Will await final results ./cy

## 2019-12-04 ENCOUNTER — Other Ambulatory Visit: Payer: Self-pay

## 2019-12-04 ENCOUNTER — Ambulatory Visit: Payer: Medicare Other | Admitting: Cardiovascular Disease

## 2019-12-04 ENCOUNTER — Encounter: Payer: Self-pay | Admitting: Cardiovascular Disease

## 2019-12-04 DIAGNOSIS — I340 Nonrheumatic mitral (valve) insufficiency: Secondary | ICD-10-CM | POA: Diagnosis not present

## 2019-12-04 DIAGNOSIS — E782 Mixed hyperlipidemia: Secondary | ICD-10-CM | POA: Diagnosis not present

## 2019-12-04 DIAGNOSIS — I4811 Longstanding persistent atrial fibrillation: Secondary | ICD-10-CM | POA: Diagnosis not present

## 2019-12-04 DIAGNOSIS — I10 Essential (primary) hypertension: Secondary | ICD-10-CM

## 2019-12-04 DIAGNOSIS — I351 Nonrheumatic aortic (valve) insufficiency: Secondary | ICD-10-CM | POA: Diagnosis not present

## 2019-12-04 NOTE — Progress Notes (Signed)
12/04/2019 JONERIK SLIKER   1940-11-14  [id number]  Primary Physician Lonie Peak, PA-C Primary Cardiologist: Runell Gess MD Nicholes Calamity, [place]  HPI:  Jackson Lawson is a 79 y.o. thin appearing married Caucasian male father of 3 daughters, grandfather of 6 grandchildren is accompanied by his wife [name] today. He was referred by Lonie Peak, PA-C for evaluation of A. fib. He has seen Dr. Willa Rough remotely in the 90s. He is retired from being a Surveyor, minerals.  I last saw him in the office 10/02/2019.  His risk factors include treated hypertension. There is a question of a heart attack back in 1995 with a heart cath performed 03/17/2000 that showed minimal CAD. He also has history of aortic insufficiency, mitral regurgitation and aortic root dilatation. He gets occasional atypical chest pain. He was seen by Lonie Peak and on in the office 11/6 and found to be in A. fib and placed on Eliquis oral anticoagulation.  Since I saw him 2 months ago I did get an event monitor which confirmed A. fib/a flutter.  A 2D echo revealed normal LV systolic function with no evidence of aortic insufficiency or mitral regurgitation.  His aortic dimension was 42 mm.  He denies chest pain or shortness of breath.  He is on Eliquis oral anticoagulation.   Current Meds  Medication Sig  . amLODipine (NORVASC) 10 MG tablet Take 10 mg by mouth daily.  [name] apixaban (ELIQUIS) 2.5 MG TABS tablet Take 2.5 mg by mouth 2 (two) times daily.  . Ascorbic Acid (VITAMIN C) 100 MG tablet Take by mouth.  [name] aspirin EC 81 MG tablet Take by mouth.  . betamethasone, augmented, (DIPROLENE) 0.05 % gel betamethasone, augmented 0.05 % topical gel  . cefUROXime (CEFTIN) 500 MG tablet Take 500 mg by mouth 2 (two) times daily.  . Cholecalciferol (VITAMIN D-1000 MAX ST) 25 MCG (1000 UT) tablet Take by mouth.  . leflunomide (ARAVA) 20 MG tablet Take 20 mg by mouth daily.  [name] levofloxacin (LEVAQUIN) 500 MG tablet Take 500 mg by  mouth daily.  [name] losartan-hydrochlorothiazide (HYZAAR) 100-25 MG tablet Take by mouth.  . metoprolol tartrate (LOPRESSOR) 50 MG tablet metoprolol tartrate 50 mg tablet  . naproxen sodium (ALEVE) 220 MG tablet Take by mouth.  [name] omeprazole (PRILOSEC) 40 MG capsule Take 40 mg by mouth daily.  . [DISCONTINUED] ciprofloxacin (CIPRO) 500 MG tablet Take 500 mg by mouth 2 (two) times daily.  . [DISCONTINUED] FLUoxetine (PROZAC) 20 MG capsule Take 20 capsules by mouth daily.  . [DISCONTINUED] lisinopril-hydrochlorothiazide (ZESTORETIC) 20-25 MG tablet lisinopril 20 mg-hydrochlorothiazide 25 mg tablet     Allergies  Allergen Reactions  . Fenofibrate Diarrhea  . Simvastatin Diarrhea    Social History   Socioeconomic History  . Marital status: Married    Spouse name: Not on file  . Number of children: Not on file  . Years of education: Not on file  . Highest education level: Not on file  Occupational History  . Not on file  Tobacco Use  . Smoking status: Never Smoker  . Smokeless tobacco: Never Used  Substance and Sexual Activity  . Alcohol use: Not on file  . Drug use: Not on file  . Sexual activity: Not on file  Other Topics Concern  . Not on file  Social History Narrative  . Not on file   Social Determinants of Health   Financial Resource Strain:   . Difficulty of Paying Living Expenses:  Not on file  Food Insecurity:   . Worried About Charity fundraiser in the Last Year: Not on file  . Ran Out of Food in the Last Year: Not on file  Transportation Needs:   . Lack of Transportation (Medical): Not on file  . Lack of Transportation (Non-Medical): Not on file  Physical Activity:   . Days of Exercise per Week: Not on file  . Minutes of Exercise per Session: Not on file  Stress:   . Feeling of Stress : Not on file  Social Connections:   . Frequency of Communication with Friends and Family: Not on file  . Frequency of Social Gatherings with Friends and Family: Not on file  .  Attends Religious Services: Not on file  . Active Member of Clubs or Organizations: Not on file  . Attends Archivist Meetings: Not on file  . Marital Status: Not on file  Intimate Partner Violence:   . Fear of Current or Ex-Partner: Not on file  . Emotionally Abused: Not on file  . Physically Abused: Not on file  . Sexually Abused: Not on file     Review of Systems: General: negative for chills, fever, night sweats or weight changes.  Cardiovascular: negative for chest pain, dyspnea on exertion, edema, orthopnea, palpitations, paroxysmal nocturnal dyspnea or shortness of breath Dermatological: negative for rash Respiratory: negative for cough or wheezing Urologic: negative for hematuria Abdominal: negative for nausea, vomiting, diarrhea, bright red blood per rectum, melena, or hematemesis Neurologic: negative for visual changes, syncope, or dizziness All other systems reviewed and are otherwise negative except as noted above.    Blood pressure (!) 145/91, pulse 79, temperature (!) 97.5 F (36.4 C), height 6\' 1"  (1.854 m), weight 165 lb (74.8 kg), SpO2 90 %.  General appearance: alert and no distress Neck: no adenopathy, no carotid bruit, no JVD, supple, symmetrical, trachea midline and thyroid not enlarged, symmetric, no tenderness/mass/nodules Lungs: clear to auscultation bilaterally Heart: irregularly irregular rhythm Extremities: extremities normal, atraumatic, no cyanosis or edema Pulses: 2+ and symmetric Skin: Skin color, texture, turgor normal. No rashes or lesions Neurologic: Alert and oriented X 3, normal strength and tone. Normal symmetric reflexes. Normal coordination and gait  EKG not performed today  ASSESSMENT AND PLAN:   Essential hypertension History of essential hypertension with blood pressure measured today at 145/91.  He is on amlodipine, metoprolol, losartan and hydrochlorothiazide.  His blood pressure is higher today than it has usually been.   Hyperlipidemia History of hyperlipidemia not on statin therapy with lipid profile performed 01/28/2017 revealing total cholesterol 186, LDL 107 and HDL of 30.  Aortic insufficiency History of aortic insufficiency however recent 2D echo performed 10/22/2019 did not show evidence of aortic insufficiency.  Mitral regurgitation History of mitral regurgitation with recent echo that did not show this.  Atrial fibrillation ([place]) History of persistent A. fib rate controlled on Eliquis oral anticoagulation.      Lorretta Harp MD FACP,FACC,FAHA, [facility] 12/04/2019 10:40 AM

## 2019-12-04 NOTE — Patient Instructions (Signed)
Medication Instructions:  Your physician recommends that you continue on your current medications as directed. Please refer to the Current Medication list given to you today.  If you need a refill on your cardiac medications before your next appointment, please call your pharmacy.   Lab work: NONE  Testing/Procedures: NONE  Follow-Up: At [facility], you and your health needs are our priority.  As part of our continuing mission to provide you with exceptional heart care, we have created designated Provider Care Teams.  These Care Teams include your primary Cardiologist (physician) and Advanced Practice Providers (APPs -  Physician Assistants and Nurse Practitioners) who all work together to provide you with the care you need, when you need it. You may see Dr. Berry or one of the following Advanced Practice Providers on your designated Care Team:    Luke Kilroy, PA-C  Callie Goodrich, PA-C  Jesse Cleaver, FNP  Your physician wants you to follow-up in: 1 year with Dr. Berry       

## 2019-12-04 NOTE — Assessment & Plan Note (Signed)
History of hyperlipidemia not on statin therapy with lipid profile performed 01/28/2017 revealing total cholesterol 186, LDL 107 and HDL of 30.

## 2019-12-04 NOTE — Assessment & Plan Note (Signed)
History of mitral regurgitation with recent echo that did not show this.

## 2019-12-04 NOTE — Assessment & Plan Note (Signed)
History of essential hypertension with blood pressure measured today at 145/91.  He is on amlodipine, metoprolol, losartan and hydrochlorothiazide.  His blood pressure is higher today than it has usually been.

## 2019-12-04 NOTE — Assessment & Plan Note (Signed)
History of persistent A-fib rate controlled on Eliquis oral anticoagulation. 

## 2019-12-04 NOTE — Assessment & Plan Note (Signed)
History of aortic insufficiency however recent 2D echo performed 10/22/2019 did not show evidence of aortic insufficiency.

## 2019-12-07 ENCOUNTER — Telehealth: Payer: Self-pay

## 2019-12-07 NOTE — Telephone Encounter (Signed)
Called pt for results. Pt could not hear me. Tried to speak louder and could not get results.

## 2019-12-07 NOTE — Telephone Encounter (Signed)
-----   Message from Runell Gess, MD sent at 12/06/2019 11:10 AM EST ----- 1: Atrial flutter with variable conduction

## 2020-01-14 DIAGNOSIS — R7303 Prediabetes: Secondary | ICD-10-CM | POA: Diagnosis not present

## 2020-01-14 DIAGNOSIS — I2510 Atherosclerotic heart disease of native coronary artery without angina pectoris: Secondary | ICD-10-CM | POA: Diagnosis not present

## 2020-01-14 DIAGNOSIS — I10 Essential (primary) hypertension: Secondary | ICD-10-CM | POA: Diagnosis not present

## 2020-01-14 DIAGNOSIS — D696 Thrombocytopenia, unspecified: Secondary | ICD-10-CM | POA: Diagnosis not present

## 2020-01-14 DIAGNOSIS — I4891 Unspecified atrial fibrillation: Secondary | ICD-10-CM | POA: Diagnosis not present

## 2020-01-14 DIAGNOSIS — M069 Rheumatoid arthritis, unspecified: Secondary | ICD-10-CM | POA: Diagnosis not present

## 2020-02-18 DIAGNOSIS — R634 Abnormal weight loss: Secondary | ICD-10-CM | POA: Diagnosis not present

## 2020-02-18 DIAGNOSIS — Z79899 Other long term (current) drug therapy: Secondary | ICD-10-CM | POA: Diagnosis not present

## 2020-02-18 DIAGNOSIS — M0579 Rheumatoid arthritis with rheumatoid factor of multiple sites without organ or systems involvement: Secondary | ICD-10-CM | POA: Diagnosis not present

## 2020-02-18 DIAGNOSIS — M720 Palmar fascial fibromatosis [Dupuytren]: Secondary | ICD-10-CM | POA: Diagnosis not present

## 2020-02-18 DIAGNOSIS — R0989 Other specified symptoms and signs involving the circulatory and respiratory systems: Secondary | ICD-10-CM | POA: Diagnosis not present

## 2020-06-26 DIAGNOSIS — Z9181 History of falling: Secondary | ICD-10-CM | POA: Diagnosis not present

## 2020-06-26 DIAGNOSIS — E785 Hyperlipidemia, unspecified: Secondary | ICD-10-CM | POA: Diagnosis not present

## 2020-06-26 DIAGNOSIS — Z0000 Encounter for general adult medical examination without abnormal findings: Secondary | ICD-10-CM | POA: Diagnosis not present

## 2020-07-16 DIAGNOSIS — R7303 Prediabetes: Secondary | ICD-10-CM | POA: Diagnosis not present

## 2020-07-16 DIAGNOSIS — I4891 Unspecified atrial fibrillation: Secondary | ICD-10-CM | POA: Diagnosis not present

## 2020-07-16 DIAGNOSIS — I10 Essential (primary) hypertension: Secondary | ICD-10-CM | POA: Diagnosis not present

## 2020-07-16 DIAGNOSIS — I2510 Atherosclerotic heart disease of native coronary artery without angina pectoris: Secondary | ICD-10-CM | POA: Diagnosis not present

## 2020-07-16 DIAGNOSIS — D696 Thrombocytopenia, unspecified: Secondary | ICD-10-CM | POA: Diagnosis not present

## 2020-07-19 DIAGNOSIS — R05 Cough: Secondary | ICD-10-CM | POA: Diagnosis not present

## 2020-07-19 DIAGNOSIS — R509 Fever, unspecified: Secondary | ICD-10-CM | POA: Diagnosis not present

## 2020-07-19 DIAGNOSIS — N3001 Acute cystitis with hematuria: Secondary | ICD-10-CM | POA: Diagnosis not present

## 2020-07-19 DIAGNOSIS — R350 Frequency of micturition: Secondary | ICD-10-CM | POA: Diagnosis not present

## 2020-07-19 DIAGNOSIS — R0902 Hypoxemia: Secondary | ICD-10-CM | POA: Diagnosis not present

## 2020-08-13 DIAGNOSIS — D696 Thrombocytopenia, unspecified: Secondary | ICD-10-CM | POA: Diagnosis not present

## 2020-12-03 ENCOUNTER — Ambulatory Visit: Payer: Medicare Other | Admitting: Cardiovascular Disease

## 2020-12-03 ENCOUNTER — Encounter: Payer: Self-pay | Admitting: Cardiovascular Disease

## 2020-12-03 ENCOUNTER — Other Ambulatory Visit: Payer: Self-pay

## 2020-12-03 VITALS — BP 130/82 | HR 60 | Ht 74.0 in | Wt 168.8 lb

## 2020-12-03 DIAGNOSIS — I2510 Atherosclerotic heart disease of native coronary artery without angina pectoris: Secondary | ICD-10-CM

## 2020-12-03 DIAGNOSIS — I4891 Unspecified atrial fibrillation: Secondary | ICD-10-CM

## 2020-12-03 DIAGNOSIS — E782 Mixed hyperlipidemia: Secondary | ICD-10-CM | POA: Diagnosis not present

## 2020-12-03 DIAGNOSIS — I351 Nonrheumatic aortic (valve) insufficiency: Secondary | ICD-10-CM | POA: Diagnosis not present

## 2020-12-03 DIAGNOSIS — I340 Nonrheumatic mitral (valve) insufficiency: Secondary | ICD-10-CM

## 2020-12-03 DIAGNOSIS — I10 Essential (primary) hypertension: Secondary | ICD-10-CM

## 2020-12-03 NOTE — Progress Notes (Signed)
12/03/2020 Jackson Lawson   11/27/1940  [id number]  Primary Physician Jackson Peak, PA-C Primary Cardiologist: Jackson Gess MD Jackson Lawson, [place]  HPI:  Jackson Lawson is a 80 y.o.   thin appearing married Caucasian male father of 3 daughters, grandfather of 6 grandchildren is accompanied by his wife Jackson Lawson today. He was referred by Jackson Peak, PA-C for evaluation of A. fib. He has seen Dr. Willa Lawson remotely in the 90s. He is retired from being a Surveyor, [employer].  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  His risk factors include treated hypertension. There is a question of a heart attack back in 1995 with a heart cath performed 03/17/2000 that showed minimal CAD. He also has history of aortic insufficiency, mitral regurgitation and aortic root dilatation. He gets occasional atypical chest pain. He was seen by Jackson Lawson and on in the office 11/6 and found to be in A. fib and placed on Eliquis oral anticoagulation.   I did get an event monitor which confirmed A. fib/a flutter.  A 2D echo revealed normal LV systolic function with no evidence of aortic insufficiency or mitral regurgitation.  His aortic dimension was 42 mm.  He denies chest pain or shortness of breath.  He is on Eliquis oral anticoagulation.  Since I saw him a year ago he has done well.  He gets occasional atypical noncardiac chest pain.  He did have COVID-19 this past summer and had been vaccinated in February and March prior to that.   Current Meds  Medication Sig  . amLODipine (NORVASC) 10 MG tablet Take 10 mg by mouth daily.  [name] apixaban (ELIQUIS) 5 MG TABS tablet Take 5 mg by mouth 2 (two) times daily.  . Ascorbic Acid (VITAMIN C) 100 MG tablet Take by mouth.  [name] aspirin EC 81 MG tablet Take by mouth.  . betamethasone, augmented, (DIPROLENE) 0.05 % gel betamethasone, augmented 0.05 % topical gel  . cefUROXime (CEFTIN) 500 MG tablet Take 500 mg by mouth 2 (two) times daily.  . Cholecalciferol (VITAMIN D-1000  MAX ST) 25 MCG (1000 UT) tablet Take by mouth.  [name] FLUoxetine (PROZAC) 20 MG capsule Take 20 mg by mouth daily.  [name] leflunomide (ARAVA) 20 MG tablet Take 20 mg by mouth daily.  [name] levofloxacin (LEVAQUIN) 500 MG tablet Take 500 mg by mouth daily.  [name] losartan-hydrochlorothiazide (HYZAAR) 100-25 MG tablet Take by mouth.  . metoprolol tartrate (LOPRESSOR) 50 MG tablet metoprolol tartrate 50 mg tablet  . naproxen sodium (ALEVE) 220 MG tablet Take by mouth.  [name] omeprazole (PRILOSEC) 40 MG capsule Take 40 mg by mouth daily.     Allergies  Allergen Reactions  . Fenofibrate Diarrhea  . Simvastatin Diarrhea    Social History   Socioeconomic History  . Marital status: Married    Spouse name: Not on file  . Number of children: Not on file  . Years of education: Not on file  . Highest education level: Not on file  Occupational History  . Not on file  Tobacco Use  . Smoking status: Never Smoker  . Smokeless tobacco: Never Used  Substance and Sexual Activity  . Alcohol use: Not on file  . Drug use: Not on file  . Sexual activity: Not on file  Other Topics Concern  . Not on file  Social History Narrative  . Not on file   Social Determinants of Health   Financial Resource Strain: Not on file  Food  Insecurity: Not on file  Transportation Needs: Not on file  Physical Activity: Not on file  Stress: Not on file  Social Connections: Not on file  Intimate Partner Violence: Not on file     Review of Systems: General: negative for chills, fever, night sweats or weight changes.  Cardiovascular: negative for chest pain, dyspnea on exertion, edema, orthopnea, palpitations, paroxysmal nocturnal dyspnea or shortness of breath Dermatological: negative for rash Respiratory: negative for cough or wheezing Urologic: negative for hematuria Abdominal: negative for nausea, vomiting, diarrhea, bright red blood per rectum, melena, or hematemesis Neurologic: negative for visual changes, syncope, or  dizziness All other systems reviewed and are otherwise negative except as noted above.    Blood pressure (!) 148/88, pulse 60, height 6\' 2"  (1.88 m), weight 168 lb 12.8 oz (76.6 kg).  General appearance: alert and no distress Neck: no adenopathy, no carotid bruit, no JVD, supple, symmetrical, trachea midline and thyroid not enlarged, symmetric, no tenderness/mass/nodules Lungs: clear to auscultation bilaterally Heart: regular rate and rhythm, S1, S2 normal, no murmur, click, rub or gallop Extremities: extremities normal, atraumatic, no cyanosis or edema Pulses: 2+ and symmetric Skin: Skin color, texture, turgor normal. No rashes or lesions Neurologic: Alert and oriented X 3, normal strength and tone. Normal symmetric reflexes. Normal coordination and gait  EKG sinus rhythm at 60 with left axis deviation and right bundle branch block.  I personally reviewed this EKG.  ASSESSMENT AND PLAN:   Essential hypertension History of essential hypertension a blood pressure measured at 148/88.  He is on amlodipine, metoprolol, losartan and hydrochlorothiazide.  Hyperlipidemia History of hyperlipidemia not on statin therapy.  We will check a lipid liver profile this morning.  Aortic insufficiency History of aortic insufficiency mitral insufficiency not seen on 2D echo performed 10/22/2019  Coronary artery disease History of CAD status post cardiac cath 03/17/2000 showing minimal CAD.  He gets occasional atypical sharp chest pain which does not sound ischemic.  Atrial fibrillation ([facility]) History of paroxysmal atrial flutter which he is asymptomatic from currently in sinus rhythm on Eliquis oral anticoagulation.      05/17/2000 MD FACP,FACC,FAHA, [facility] 12/03/2020 10:50 AM

## 2020-12-03 NOTE — Patient Instructions (Addendum)
Medication Instructions:  Your physician recommends that you continue on your current medications as directed. Please refer to the Current Medication list given to you today.  *If you need a refill on your cardiac medications before your next appointment, please call your pharmacy*   Lab Work: Your physician recommends that you have labs drawn today: lipid/liver profile  If you have labs (blood work) drawn today and your tests are completely normal, you will receive your results only by: [name] MyChart Message (if you have MyChart) OR . A paper copy in the mail If you have any lab test that is abnormal or we need to change your treatment, we will call you to review the results.  Testing/Procedures: Your physician has requested that you have an echocardiogram. Echocardiography is a painless test that uses sound waves to create images of your heart. It provides your doctor with information about the size and shape of your heart and how well your heart's chambers and valves are working. This procedure takes approximately one hour. There are no restrictions for this procedure. This will be done at [address]   Follow-Up: At [facility], you and your health needs are our priority.  As part of our continuing mission to provide you with exceptional heart care, we have created designated Provider Care Teams.  These Care Teams include your primary Cardiologist (physician) and Advanced Practice Providers (APPs -  Physician Assistants and Nurse Practitioners) who all work together to provide you with the care you need, when you need it.  We recommend signing up for the patient portal called "MyChart".  Sign up information is provided on this After Visit Summary.  MyChart is used to connect with patients for Virtual Visits (Telemedicine).  Patients are able to view lab/test results, encounter notes, upcoming appointments, etc.  Non-urgent messages can be sent to your provider as well.   To  learn more about what you can do with MyChart, go to [email or web address].    Your next appointment:   12 month(s)  The format for your next appointment:   In Person  Provider:   Nanetta Batty, MD

## 2020-12-03 NOTE — Assessment & Plan Note (Signed)
History of aortic insufficiency mitral insufficiency not seen on 2D echo performed 10/22/2019

## 2020-12-03 NOTE — Assessment & Plan Note (Addendum)
History of paroxysmal atrial flutter which he is asymptomatic from currently in sinus rhythm on Eliquis oral anticoagulation.

## 2020-12-03 NOTE — Assessment & Plan Note (Signed)
History of essential hypertension a blood pressure measured at 148/88.  He is on amlodipine, metoprolol, losartan and hydrochlorothiazide.

## 2020-12-03 NOTE — Assessment & Plan Note (Signed)
History of CAD status post cardiac cath 03/17/2000 showing minimal CAD.  He gets occasional atypical sharp chest pain which does not sound ischemic.

## 2020-12-03 NOTE — Assessment & Plan Note (Signed)
History of hyperlipidemia not on statin therapy.  We will check a lipid liver profile this morning.

## 2020-12-04 LAB — LIPID PANEL
Chol/HDL Ratio: 4.2 ratio (ref 0.0–5.0)
Cholesterol, Total: 197 mg/dL (ref 100–199)
HDL: 47 mg/dL (ref >39)
LDL Chol Calc (NIH): 125 mg/dL — ABNORMAL HIGH (ref 0–99)
Triglycerides: 143 mg/dL (ref 0–149)
VLDL Cholesterol Cal: 25 mg/dL (ref 5–40)

## 2020-12-04 LAB — HEPATIC FUNCTION PANEL
ALT: 9 IU/L (ref 0–44)
AST: 17 IU/L (ref 0–40)
Albumin: 4.4 g/dL (ref 3.7–4.7)
Alkaline Phosphatase: 91 IU/L (ref 44–121)
Bilirubin Total: 0.5 mg/dL (ref 0.0–1.2)
Bilirubin, Direct: 0.14 mg/dL (ref 0.00–0.40)
Total Protein: 8.6 g/dL — ABNORMAL HIGH (ref 6.0–8.5)

## 2020-12-10 DIAGNOSIS — E782 Mixed hyperlipidemia: Secondary | ICD-10-CM

## 2020-12-26 ENCOUNTER — Ambulatory Visit (HOSPITAL_COMMUNITY): Payer: Medicare Other | Attending: Internal Medicine

## 2020-12-26 ENCOUNTER — Other Ambulatory Visit: Payer: Self-pay

## 2020-12-26 DIAGNOSIS — I351 Nonrheumatic aortic (valve) insufficiency: Secondary | ICD-10-CM | POA: Insufficient documentation

## 2020-12-26 DIAGNOSIS — I340 Nonrheumatic mitral (valve) insufficiency: Secondary | ICD-10-CM | POA: Insufficient documentation

## 2020-12-26 LAB — ECHOCARDIOGRAM COMPLETE
Area-P 1/2: 2.66 cm2
P 1/2 time: 436 msec
S' Lateral: 3.2 cm

## 2021-01-14 DIAGNOSIS — I2510 Atherosclerotic heart disease of native coronary artery without angina pectoris: Secondary | ICD-10-CM | POA: Diagnosis not present

## 2021-01-14 DIAGNOSIS — I10 Essential (primary) hypertension: Secondary | ICD-10-CM | POA: Diagnosis not present

## 2021-01-14 DIAGNOSIS — I4891 Unspecified atrial fibrillation: Secondary | ICD-10-CM | POA: Diagnosis not present

## 2021-01-14 DIAGNOSIS — Z139 Encounter for screening, unspecified: Secondary | ICD-10-CM | POA: Diagnosis not present

## 2021-01-14 DIAGNOSIS — M720 Palmar fascial fibromatosis [Dupuytren]: Secondary | ICD-10-CM | POA: Diagnosis not present

## 2021-01-14 DIAGNOSIS — H6121 Impacted cerumen, right ear: Secondary | ICD-10-CM | POA: Diagnosis not present

## 2021-01-14 DIAGNOSIS — Z6822 Body mass index (BMI) 22.0-22.9, adult: Secondary | ICD-10-CM | POA: Diagnosis not present

## 2021-01-14 DIAGNOSIS — R7303 Prediabetes: Secondary | ICD-10-CM | POA: Diagnosis not present

## 2021-01-14 DIAGNOSIS — D696 Thrombocytopenia, unspecified: Secondary | ICD-10-CM | POA: Diagnosis not present

## 2021-06-29 DIAGNOSIS — Z9181 History of falling: Secondary | ICD-10-CM | POA: Diagnosis not present

## 2021-06-29 DIAGNOSIS — Z0000 Encounter for general adult medical examination without abnormal findings: Secondary | ICD-10-CM | POA: Diagnosis not present

## 2021-06-29 DIAGNOSIS — E785 Hyperlipidemia, unspecified: Secondary | ICD-10-CM | POA: Diagnosis not present

## 2021-07-20 DIAGNOSIS — R2232 Localized swelling, mass and lump, left upper limb: Secondary | ICD-10-CM | POA: Diagnosis not present

## 2021-07-20 DIAGNOSIS — M720 Palmar fascial fibromatosis [Dupuytren]: Secondary | ICD-10-CM | POA: Diagnosis not present

## 2021-07-20 DIAGNOSIS — Z23 Encounter for immunization: Secondary | ICD-10-CM | POA: Diagnosis not present

## 2021-07-20 DIAGNOSIS — D696 Thrombocytopenia, unspecified: Secondary | ICD-10-CM | POA: Diagnosis not present

## 2021-07-20 DIAGNOSIS — I10 Essential (primary) hypertension: Secondary | ICD-10-CM | POA: Diagnosis not present

## 2021-07-20 DIAGNOSIS — I4891 Unspecified atrial fibrillation: Secondary | ICD-10-CM | POA: Diagnosis not present

## 2021-07-20 DIAGNOSIS — Z6821 Body mass index (BMI) 21.0-21.9, adult: Secondary | ICD-10-CM | POA: Diagnosis not present

## 2021-07-20 DIAGNOSIS — R7303 Prediabetes: Secondary | ICD-10-CM | POA: Diagnosis not present

## 2021-07-20 DIAGNOSIS — I2510 Atherosclerotic heart disease of native coronary artery without angina pectoris: Secondary | ICD-10-CM | POA: Diagnosis not present

## 2021-07-22 DIAGNOSIS — M7989 Other specified soft tissue disorders: Secondary | ICD-10-CM | POA: Diagnosis not present

## 2021-07-22 DIAGNOSIS — R2232 Localized swelling, mass and lump, left upper limb: Secondary | ICD-10-CM | POA: Diagnosis not present

## 2021-10-10 DIAGNOSIS — R509 Fever, unspecified: Secondary | ICD-10-CM | POA: Diagnosis not present

## 2021-10-10 DIAGNOSIS — N390 Urinary tract infection, site not specified: Secondary | ICD-10-CM | POA: Diagnosis not present

## 2022-01-26 DIAGNOSIS — Z6821 Body mass index (BMI) 21.0-21.9, adult: Secondary | ICD-10-CM | POA: Diagnosis not present

## 2022-01-26 DIAGNOSIS — D696 Thrombocytopenia, unspecified: Secondary | ICD-10-CM | POA: Diagnosis not present

## 2022-01-26 DIAGNOSIS — Z139 Encounter for screening, unspecified: Secondary | ICD-10-CM | POA: Diagnosis not present

## 2022-01-26 DIAGNOSIS — M720 Palmar fascial fibromatosis [Dupuytren]: Secondary | ICD-10-CM | POA: Diagnosis not present

## 2022-01-26 DIAGNOSIS — I2510 Atherosclerotic heart disease of native coronary artery without angina pectoris: Secondary | ICD-10-CM | POA: Diagnosis not present

## 2022-01-26 DIAGNOSIS — M069 Rheumatoid arthritis, unspecified: Secondary | ICD-10-CM | POA: Diagnosis not present

## 2022-01-26 DIAGNOSIS — I4891 Unspecified atrial fibrillation: Secondary | ICD-10-CM | POA: Diagnosis not present

## 2022-01-26 DIAGNOSIS — R6889 Other general symptoms and signs: Secondary | ICD-10-CM | POA: Diagnosis not present

## 2022-01-26 DIAGNOSIS — I10 Essential (primary) hypertension: Secondary | ICD-10-CM | POA: Diagnosis not present

## 2022-01-26 DIAGNOSIS — R7303 Prediabetes: Secondary | ICD-10-CM | POA: Diagnosis not present

## 2022-07-01 DIAGNOSIS — Z9181 History of falling: Secondary | ICD-10-CM | POA: Diagnosis not present

## 2022-07-01 DIAGNOSIS — Z0000 Encounter for general adult medical examination without abnormal findings: Secondary | ICD-10-CM | POA: Diagnosis not present

## 2022-08-09 DIAGNOSIS — Z23 Encounter for immunization: Secondary | ICD-10-CM | POA: Diagnosis not present

## 2022-08-09 DIAGNOSIS — I10 Essential (primary) hypertension: Secondary | ICD-10-CM | POA: Diagnosis not present

## 2022-08-09 DIAGNOSIS — I4891 Unspecified atrial fibrillation: Secondary | ICD-10-CM | POA: Diagnosis not present

## 2022-08-09 DIAGNOSIS — E8809 Other disorders of plasma-protein metabolism, not elsewhere classified: Secondary | ICD-10-CM | POA: Diagnosis not present

## 2022-08-09 DIAGNOSIS — M720 Palmar fascial fibromatosis [Dupuytren]: Secondary | ICD-10-CM | POA: Diagnosis not present

## 2022-08-09 DIAGNOSIS — M069 Rheumatoid arthritis, unspecified: Secondary | ICD-10-CM | POA: Diagnosis not present

## 2022-08-09 DIAGNOSIS — D696 Thrombocytopenia, unspecified: Secondary | ICD-10-CM | POA: Diagnosis not present

## 2022-08-09 DIAGNOSIS — R7303 Prediabetes: Secondary | ICD-10-CM | POA: Diagnosis not present

## 2022-08-09 DIAGNOSIS — I2510 Atherosclerotic heart disease of native coronary artery without angina pectoris: Secondary | ICD-10-CM | POA: Diagnosis not present

## 2022-08-09 DIAGNOSIS — R634 Abnormal weight loss: Secondary | ICD-10-CM | POA: Diagnosis not present

## 2022-08-09 DIAGNOSIS — R7989 Other specified abnormal findings of blood chemistry: Secondary | ICD-10-CM | POA: Diagnosis not present

## 2022-08-24 ENCOUNTER — Telehealth: Payer: Self-pay | Admitting: Hematology

## 2022-08-24 NOTE — Telephone Encounter (Signed)
Scheduled appt per 10/17 referral. Pt is aware of appt date and time. Pt is aware to arrive 15 mins prior to appt time and to bring and updated insurance card. Pt is aware of appt location.   

## 2022-09-01 DIAGNOSIS — E871 Hypo-osmolality and hyponatremia: Secondary | ICD-10-CM | POA: Diagnosis not present

## 2022-09-10 ENCOUNTER — Inpatient Hospital Stay: Payer: Medicare Other

## 2022-09-10 ENCOUNTER — Inpatient Hospital Stay: Payer: Medicare Other | Attending: Hematology | Admitting: Hematology

## 2022-09-10 ENCOUNTER — Other Ambulatory Visit: Payer: Self-pay

## 2022-09-10 VITALS — BP 131/82 | HR 54 | Temp 97.6°F | Resp 16 | Wt 155.4 lb

## 2022-09-10 DIAGNOSIS — I4891 Unspecified atrial fibrillation: Secondary | ICD-10-CM

## 2022-09-10 DIAGNOSIS — Z79899 Other long term (current) drug therapy: Secondary | ICD-10-CM | POA: Insufficient documentation

## 2022-09-10 DIAGNOSIS — Z7952 Long term (current) use of systemic steroids: Secondary | ICD-10-CM | POA: Diagnosis not present

## 2022-09-10 DIAGNOSIS — Z7969 Long term (current) use of other immunomodulators and immunosuppressants: Secondary | ICD-10-CM | POA: Insufficient documentation

## 2022-09-10 DIAGNOSIS — E785 Hyperlipidemia, unspecified: Secondary | ICD-10-CM

## 2022-09-10 DIAGNOSIS — D892 Hypergammaglobulinemia, unspecified: Secondary | ICD-10-CM

## 2022-09-10 DIAGNOSIS — Z7901 Long term (current) use of anticoagulants: Secondary | ICD-10-CM | POA: Diagnosis not present

## 2022-09-10 DIAGNOSIS — I10 Essential (primary) hypertension: Secondary | ICD-10-CM | POA: Diagnosis not present

## 2022-09-10 DIAGNOSIS — M069 Rheumatoid arthritis, unspecified: Secondary | ICD-10-CM | POA: Diagnosis not present

## 2022-09-10 DIAGNOSIS — R768 Other specified abnormal immunological findings in serum: Secondary | ICD-10-CM | POA: Diagnosis not present

## 2022-09-10 LAB — CBC WITH DIFFERENTIAL (CANCER CENTER ONLY)
Abs Immature Granulocytes: 0.01 10*3/uL (ref 0.00–0.07)
Basophils Absolute: 0.1 10*3/uL (ref 0.0–0.1)
Basophils Relative: 1 %
Eosinophils Absolute: 1 10*3/uL — ABNORMAL HIGH (ref 0.0–0.5)
Eosinophils Relative: 12 %
HCT: 44.6 % (ref 39.0–52.0)
Hemoglobin: 14.6 g/dL (ref 13.0–17.0)
Immature Granulocytes: 0 %
Lymphocytes Relative: 17 %
Lymphs Abs: 1.5 10*3/uL (ref 0.7–4.0)
MCH: 30 pg (ref 26.0–34.0)
MCHC: 32.7 g/dL (ref 30.0–36.0)
MCV: 91.6 fL (ref 80.0–100.0)
Monocytes Absolute: 0.6 10*3/uL (ref 0.1–1.0)
Monocytes Relative: 6 %
Neutro Abs: 5.7 10*3/uL (ref 1.7–7.7)
Neutrophils Relative %: 64 %
Platelet Count: 127 10*3/uL — ABNORMAL LOW (ref 150–400)
RBC: 4.87 MIL/uL (ref 4.22–5.81)
RDW: 14 % (ref 11.5–15.5)
WBC Count: 8.9 10*3/uL (ref 4.0–10.5)
nRBC: 0 % (ref 0.0–0.2)

## 2022-09-10 LAB — CMP (CANCER CENTER ONLY)
ALT: 12 U/L (ref 0–44)
AST: 15 U/L (ref 15–41)
Albumin: 4 g/dL (ref 3.5–5.0)
Alkaline Phosphatase: 94 U/L (ref 38–126)
Anion gap: 8 (ref 5–15)
BUN: 14 mg/dL (ref 8–23)
CO2: 27 mmol/L (ref 22–32)
Calcium: 9.3 mg/dL (ref 8.9–10.3)
Chloride: 102 mmol/L (ref 98–111)
Creatinine: 0.88 mg/dL (ref 0.61–1.24)
GFR, Estimated: >60 mL/min (ref >60)
Glucose, Bld: 106 mg/dL — ABNORMAL HIGH (ref 70–99)
Potassium: 4 mmol/L (ref 3.5–5.1)
Sodium: 137 mmol/L (ref 135–145)
Total Bilirubin: 0.6 mg/dL (ref 0.3–1.2)
Total Protein: 8.9 g/dL — ABNORMAL HIGH (ref 6.5–8.1)

## 2022-09-10 LAB — SEDIMENTATION RATE: Sed Rate: 60 mm/hr — ABNORMAL HIGH (ref 0–16)

## 2022-09-10 LAB — VITAMIN D 25 HYDROXY (VIT D DEFICIENCY, FRACTURES): Vit D, 25-Hydroxy: 38.24 ng/mL (ref 30–100)

## 2022-09-10 LAB — C-REACTIVE PROTEIN: CRP: 3 mg/dL — ABNORMAL HIGH (ref <1.0)

## 2022-09-10 LAB — LACTATE DEHYDROGENASE: LDH: 199 U/L — ABNORMAL HIGH (ref 98–192)

## 2022-09-10 MED ORDER — PREDNISONE 5 MG PO TABS: 5.0000 mg | ORAL_TABLET | Freq: Every day | ORAL | 0 refills | Status: DC

## 2022-09-11 LAB — BETA 2 MICROGLOBULIN, SERUM: Beta-2 Microglobulin: 3.4 mg/L — ABNORMAL HIGH (ref 0.6–2.4)

## 2022-09-12 LAB — RHEUMATOID FACTOR: Rheumatoid fact SerPl-aCnc: 502.6 IU/mL — ABNORMAL HIGH (ref <14.0)

## 2022-09-13 LAB — KAPPA/LAMBDA LIGHT CHAINS
Kappa free light chain: 115.8 mg/L — ABNORMAL HIGH (ref 3.3–19.4)
Kappa, lambda light chain ratio: 1.38 (ref 0.26–1.65)
Lambda free light chains: 84.2 mg/L — ABNORMAL HIGH (ref 5.7–26.3)

## 2022-09-14 LAB — MULTIPLE MYELOMA PANEL, SERUM
Albumin SerPl Elph-Mcnc: 3.4 g/dL (ref 2.9–4.4)
Albumin/Glob SerPl: 0.8 (ref 0.7–1.7)
Alpha 1: 0.3 g/dL (ref 0.0–0.4)
Alpha2 Glob SerPl Elph-Mcnc: 0.9 g/dL (ref 0.4–1.0)
B-Globulin SerPl Elph-Mcnc: 1.5 g/dL — ABNORMAL HIGH (ref 0.7–1.3)
Gamma Glob SerPl Elph-Mcnc: 1.9 g/dL — ABNORMAL HIGH (ref 0.4–1.8)
Globulin, Total: 4.6 g/dL — ABNORMAL HIGH (ref 2.2–3.9)
IgA: 1128 mg/dL — ABNORMAL HIGH (ref 61–437)
IgG (Immunoglobin G), Serum: 1948 mg/dL — ABNORMAL HIGH (ref 603–1613)
IgM (Immunoglobulin M), Srm: 171 mg/dL — ABNORMAL HIGH (ref 15–143)
Total Protein ELP: 8 g/dL (ref 6.0–8.5)

## 2022-09-17 NOTE — Progress Notes (Addendum)
[name]   HEMATOLOGY/ONCOLOGY CONSULTATION NOTE  Date of Service: 09/17/2022  Patient Care Team: Cyndi Bender, PA-C as PCP - General (Physician Assistant) Brunetta Genera, MD as Consulting Physician (Hematology)  CHIEF COMPLAINTS/PURPOSE OF CONSULTATION:  Hypergammaglobulinemia  HISTORY OF PRESENTING ILLNESS:   Jackson Lawson is a wonderful 81 y.o. 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  81 year old male with a history of atrial fibrillation/flutter, coronary disease status post MI in 1995, hypertension, dyslipidemia, prediabetes, GERD, palmar fascial fibromatosis, aortic root dilatation, mitral regurgitation, BPH, aortic insufficiency, incomplete right bundle branch block, colonic diverticulosis depression.  Previous history of thrombocytopenia. History of rheumatoid arthritis for which the patient was on leflunomide which was stopped in 2001.  He has had rheumatoid arthritis for several years.  Has not tolerated methotrexate or Plaquenil in the past.  Currently does not have a rheumatologist directed rheumatology follow-up.  Patient on routine labs with his primary care physician was noted to have elevated serum protein level of 9.1  Patient had an SPEP which showed no monoclonal protein spike but an elevation in the total globulin fraction to 4.4 g/dL.  IFE showed polyclonal increase in immunoglobulins. Kappa lambda free light chain ratio  Patient has been a never smoker.  Patient notes no focal new bone pains.  No fevers no chills no night sweats no unexpected weight loss.  MEDICAL HISTORY:  Hypertension Dyslipidemia Prediabetes CAD status post  SURGICAL HISTORY: History of coronary disease status post cath  SOCIAL HISTORY: Social History   Socioeconomic History   Marital status: Married    Spouse name: Not on file   Number of children: Not on file   Years of education: Not on file   Highest  education level: Not on file  Occupational History   Not on file  Tobacco Use   Smoking status: Never   Smokeless tobacco: Never  Substance and Sexual Activity   Alcohol use: Not on file   Drug use: Not on file   Sexual activity: Not on file  Other Topics Concern   Not on file  Social History Narrative   Not on file   Social Determinants of Health   Financial Resource Strain: Not on file  Food Insecurity: Not on file  Transportation Needs: Not on file  Physical Activity: Not on file  Stress: Not on file  Social Connections: Not on file  Intimate Partner Violence: Not on file    FAMILY HISTORY: No family history on file.  ALLERGIES:  is allergic to fenofibrate and simvastatin.  MEDICATIONS:  Current Outpatient Medications  Medication Sig Dispense Refill   amLODipine (NORVASC) 10 MG tablet Take 10 mg by mouth daily.     apixaban (ELIQUIS) 5 MG TABS tablet Take 5 mg by mouth 2 (two) times daily.     FLUoxetine (PROZAC) 20 MG capsule Take 20 mg by mouth daily.     metoprolol tartrate (LOPRESSOR) 50 MG tablet metoprolol tartrate 50 mg tablet     predniSONE (DELTASONE) 5 MG tablet Take 1 tablet (5 mg total) by mouth daily with breakfast. If tolerated for 7 days may increase dose to 10 mg po daily with breakfast 60 tablet 0   Ascorbic Acid (VITAMIN C) 100 MG tablet Take by mouth. (Patient not taking: Reported on 09/10/2022)     aspirin EC 81 MG tablet Take by mouth. (Patient not taking: Reported on 09/10/2022)     betamethasone, augmented, (DIPROLENE) 0.05 % gel  betamethasone, augmented 0.05 % topical gel (Patient not taking: Reported on 09/10/2022)     cefUROXime (CEFTIN) 500 MG tablet Take 500 mg by mouth 2 (two) times daily. (Patient not taking: Reported on 09/10/2022)     Cholecalciferol (VITAMIN D-1000 MAX ST) 25 MCG (1000 UT) tablet Take by mouth. (Patient not taking: Reported on 09/10/2022)     leflunomide (ARAVA) 20 MG tablet Take 20 mg by mouth daily. (Patient not taking:  Reported on 09/10/2022)     levofloxacin (LEVAQUIN) 500 MG tablet Take 500 mg by mouth daily. (Patient not taking: Reported on 09/10/2022)     losartan-hydrochlorothiazide (HYZAAR) 100-25 MG tablet Take by mouth. (Patient not taking: Reported on 09/10/2022)     naproxen sodium (ALEVE) 220 MG tablet Take by mouth. (Patient not taking: Reported on 09/10/2022)     omeprazole (PRILOSEC) 40 MG capsule Take 40 mg by mouth daily. (Patient not taking: Reported on 09/10/2022)     No current facility-administered medications for this visit.    REVIEW OF SYSTEMS:    10 Point review of Systems was done is negative except as noted above.  PHYSICAL EXAMINATION: ECOG PERFORMANCE STATUS: 1 - Symptomatic but completely ambulatory  . Vitals:   09/10/22 1425  BP: 131/82  Pulse: (!) 54  Resp: 16  Temp: 97.6 F (36.4 C)  SpO2: 100%   Filed Weights   09/10/22 1425  Weight: 155 lb 6.4 oz (70.5 kg)   .Body mass index is 19.95 kg/m.  GENERAL:alert, in no acute distress and comfortable SKIN: no acute rashes, no significant lesions EYES: conjunctiva are pink and non-injected, sclera anicteric OROPHARYNX: MMM, no exudates, no oropharyngeal erythema or ulceration NECK: supple, no JVD LYMPH:  no palpable lymphadenopathy in the cervical, axillary or inguinal regions LUNGS: clear to auscultation b/l with normal respiratory effort HEART: regular rate & rhythm ABDOMEN:  normoactive bowel sounds , non tender, not distended. Extremity: no pedal edema PSYCH: alert & oriented x 3 with fluent speech NEURO: no focal motor/sensory deficits  LABORATORY DATA:  I have reviewed the data as listed  .    Latest Ref Rng & Units 09/10/2022    3:32 PM 03/26/2009    8:10 AM 09/09/2008    8:00 AM  CBC  WBC 4.0 - 10.5 K/uL 8.9  12.2  7.7   Hemoglobin 13.0 - 17.0 g/dL 14.6  14.8  16.3   Hematocrit 39.0 - 52.0 % 44.6  44.4  46.9   Platelets 150 - 400 K/uL 127  127  122     .    Latest Ref Rng & Units 09/10/2022     3:32 PM 12/03/2020   11:19 AM 03/26/2009    8:10 AM  CMP  Glucose 70 - 99 mg/dL 106   167   BUN 8 - 23 mg/dL 14   13   Creatinine 0.61 - 1.24 mg/dL 0.88   0.96   Sodium 135 - 145 mmol/L 137   139   Potassium 3.5 - 5.1 mmol/L 4.0   3.2   Chloride 98 - 111 mmol/L 102   105   CO2 22 - 32 mmol/L 27   26   Calcium 8.9 - 10.3 mg/dL 9.3   8.8   Total Protein 6.5 - 8.1 g/dL 8.9  8.6  6.1   Total Bilirubin 0.3 - 1.2 mg/dL 0.6  0.5  0.8   Alkaline Phos 38 - 126 U/L 94  91  48   AST 15 - 41 U/L 15  17  20   ALT 0 - 44 U/L _0 Component     Latest Ref Rng 09/10/2022  WBC     4.0 - 10.5 K/uL 8.9   RBC     4.22 - 5.81 MIL/uL 4.87   Hemoglobin     13.0 - 17.0 g/dL 14.6   HCT     39.0 - 52.0 % 44.6   MCV     80.0 - 100.0 fL 91.6   MCH     26.0 - 34.0 pg 30.0   MCHC     30.0 - 36.0 g/dL 32.7   RDW     11.5 - 15.5 % 14.0   Platelets     150 - 400 K/uL 127 (L)   nRBC     0.0 - 0.2 % 0.0   Neutrophils     % 64   NEUT#     1.7 - 7.7 K/uL 5.7   Lymphocytes     % 17   Lymphocyte #     0.7 - 4.0 K/uL 1.5   Monocytes Relative     % 6   Monocyte #     0.1 - 1.0 K/uL 0.6   Eosinophil     % 12   Eosinophils Absolute     0.0 - 0.5 K/uL 1.0 (H)   Basophil     % 1   Basophils Absolute     0.0 - 0.1 K/uL 0.1   Immature Granulocytes     % 0   Abs Immature Granulocytes     0.00 - 0.07 K/uL 0.01   Sodium     135 - 145 mmol/L 137   Potassium     3.5 - 5.1 mmol/L 4.0   Chloride     98 - 111 mmol/L 102   CO2     22 - 32 mmol/L 27   Glucose     70 - 99 mg/dL 106 (H)   BUN     8 - 23 mg/dL 14   Creatinine     0.61 - 1.24 mg/dL 0.88   Calcium     8.9 - 10.3 mg/dL 9.3   Total Protein     6.5 - 8.1 g/dL 8.9 (H)   Albumin     3.5 - 5.0 g/dL 4.0   AST     15 - 41 U/L 15   ALT     0 - 44 U/L 12   Alkaline Phosphatase     38 - 126 U/L 94   Total Bilirubin     0.3 - 1.2 mg/dL 0.6   GFR, Est Non African American     >60 mL/min >60   Anion gap     5 - 15  8   IgG  (Immunoglobin G), Serum     603 - 1,613 mg/dL 1,948 (H)   IgA     61 - 437 mg/dL 1,128 (H)   IgM (Immunoglobulin M), Srm     15 - 143 mg/dL 171 (H)   Total Protein ELP     6.0 - 8.5 g/dL 8.0 (C)  Albumin SerPl Elph-Mcnc     2.9 - 4.4 g/dL 3.4 (C)  Alpha 1     0.0 - 0.4 g/dL 0.3 (C)  Alpha2 Glob SerPl Elph-Mcnc     0.4 - 1.0 g/dL 0.9 (C)  B-Globulin SerPl Elph-Mcnc     0.7 - 1.3 g/dL 1.5 (H) (C)  Gamma Glob SerPl Elph-Mcnc     0.4 - 1.8 g/dL 1.9 (H) (C)  M Protein SerPl Elph-Mcnc     Not Observed g/dL Not Observed (C)  Globulin, Total     2.2 - 3.9 g/dL 4.6 (H) (C)  Albumin/Glob SerPl     0.7 - 1.7  0.8 (C)  IFE 1 Comment ! (C)  Please Note (HCV): Comment (C)  Kappa free light chain     3.3 - 19.4 mg/L 115.8 (H)   Lambda free light chains     5.7 - 26.3 mg/L 84.2 (H)   Kappa, lambda light chain ratio     0.26 - 1.65  1.38   Vitamin D, 25-Hydroxy     30 - 100 ng/mL 38.24   Beta-2 Microglobulin     0.6 - 2.4 mg/L 3.4 (H)   LDH     98 - 192 U/L 199 (H)   RA Latex Turbid.     <14.0 IU/mL 502.6 (H)   CRP     <1.0 mg/dL 3.0 (H)   Sed Rate     0 - 16 mm/hr 60 (H)     Legend: (L) Low (H) High ! Abnormal (C) Corrected  RADIOGRAPHIC STUDIES: I have personally reviewed the radiological images as listed and agreed with the findings in the report. No results found.  ASSESSMENT & PLAN:   Patient is an 81 year old very pleasant gentleman with history of hypertension, prediabetes, dyslipidemia with  #1 hypergammaglobinemia previous SPEP did not show an M spike.  Serum kappa lambda free light chain ratio within normal limits Likely related to significant inflammation from untreated rheumatoid arthritis We will need to rule out a clonal plasma cell disorder. Plan  Discussed all available results in detail with the patient and reviewed his outside records and confirmed it with him. Myeloma work-up ordered as noted below Start the patient on low-dose prednisone and would  recommend his primary care physician referred him to establish care with his rheumatologist again. We will start the patient on low-dose prednisone to help with his appetite as well as help his rheumatoid arthritis.  He reports significant weight loss in the last 6 months Will need nutritional consultation We will follow-up with lab results  Follow-up Labs today Phone visit with Dr. Irene Limbo in 2 weeks  Orders Placed This Encounter  Procedures   CBC with Differential ([place] Only)    Standing Status:   Future    Number of Occurrences:   1    Standing Expiration Date:   09/11/2023   CMP ([place] only)    Standing Status:   Future    Number of Occurrences:   1    Standing Expiration Date:   09/11/2023   Multiple Myeloma Panel (SPEP&IFE w/QIG)    Standing Status:   Future    Number of Occurrences:   1    Standing Expiration Date:   09/11/2023   Kappa/lambda light chains    Standing Status:   Future    Number of Occurrences:   1    Standing Expiration Date:   09/11/2023   Sedimentation rate    Standing Status:   Future    Number of Occurrences:   1    Standing Expiration Date:   09/10/2023   C-reactive protein    Standing Status:   Future    Number of Occurrences:   1    Standing Expiration Date:   09/10/2023   Rheumatoid factor  Standing Status:   Future    Number of Occurrences:   1    Standing Expiration Date:   09/10/2023   Lactate dehydrogenase    Standing Status:   Future    Number of Occurrences:   1    Standing Expiration Date:   09/11/2023   Beta 2 microglobulin    Standing Status:   Future    Number of Occurrences:   1    Standing Expiration Date:   09/10/2023   Vitamin D 25 hydroxy    Standing Status:   Future    Number of Occurrences:   1    Standing Expiration Date:   09/10/2023   The total time spent in the appointment was 47 minutes*.  All of the patient's questions were answered with apparent satisfaction. The patient knows to call the clinic with  any problems, questions or concerns.   Sullivan Lone MD MS AAHIVMS [facility] Hematology/Oncology Physician [facility]  .*Total Encounter Time as defined by the Centers for Medicare and Medicaid Services includes, in addition to the face-to-face time of a patient visit (documented in the note above) non-face-to-face time: obtaining and reviewing outside history, ordering and reviewing medications, tests or procedures, care coordination (communications with other health care professionals or caregivers) and documentation in the medical record.   09/17/2022 1:30 AM

## 2022-09-21 DIAGNOSIS — R319 Hematuria, unspecified: Secondary | ICD-10-CM | POA: Diagnosis not present

## 2022-09-24 ENCOUNTER — Inpatient Hospital Stay (HOSPITAL_BASED_OUTPATIENT_CLINIC_OR_DEPARTMENT_OTHER): Payer: Medicare Other | Admitting: Hematology

## 2022-09-24 DIAGNOSIS — Z7969 Long term (current) use of other immunomodulators and immunosuppressants: Secondary | ICD-10-CM | POA: Diagnosis not present

## 2022-09-24 DIAGNOSIS — D892 Hypergammaglobulinemia, unspecified: Secondary | ICD-10-CM

## 2022-09-24 DIAGNOSIS — R768 Other specified abnormal immunological findings in serum: Secondary | ICD-10-CM | POA: Diagnosis not present

## 2022-09-24 DIAGNOSIS — I10 Essential (primary) hypertension: Secondary | ICD-10-CM | POA: Diagnosis not present

## 2022-09-24 DIAGNOSIS — Z7901 Long term (current) use of anticoagulants: Secondary | ICD-10-CM | POA: Diagnosis not present

## 2022-09-24 DIAGNOSIS — I4891 Unspecified atrial fibrillation: Secondary | ICD-10-CM | POA: Diagnosis not present

## 2022-09-24 DIAGNOSIS — E785 Hyperlipidemia, unspecified: Secondary | ICD-10-CM | POA: Diagnosis not present

## 2022-09-24 DIAGNOSIS — Z7952 Long term (current) use of systemic steroids: Secondary | ICD-10-CM | POA: Diagnosis not present

## 2022-09-24 DIAGNOSIS — Z79899 Other long term (current) drug therapy: Secondary | ICD-10-CM | POA: Diagnosis not present

## 2022-09-24 DIAGNOSIS — M069 Rheumatoid arthritis, unspecified: Secondary | ICD-10-CM | POA: Diagnosis not present

## 2022-10-01 NOTE — Progress Notes (Addendum)
[name]   HEMATOLOGY/ONCOLOGY PHONE VISIT NOTE  Date of Service: .09/24/2022   Patient Care Team: Lonie Peak, PA-C as PCP - General (Physician Assistant) Johney Maine, MD as Consulting Physician (Hematology)  CHIEF COMPLAINTS/PURPOSE OF CONSULTATION:  Discussion of lab workup to evaluate for hypergammaglobinemia  HISTORY OF PRESENTING ILLNESS:   Jackson Lawson is a wonderful 81 y.o. 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  81 year old male with a history of atrial fibrillation/flutter, coronary disease status post MI in 1995, hypertension, dyslipidemia, prediabetes, GERD, palmar fascial fibromatosis, aortic root dilatation, mitral regurgitation, BPH, aortic insufficiency, incomplete right bundle branch block, colonic diverticulosis depression.  Previous history of thrombocytopenia. History of rheumatoid arthritis for which the patient was on leflunomide which was stopped in 2001.  He has had rheumatoid arthritis for several years.  Has not tolerated methotrexate or Plaquenil in the past.  Currently does not have a rheumatologist directed rheumatology follow-up.  Patient on routine labs with his primary care physician was noted to have elevated serum protein level of 9.1  Patient had an SPEP which showed no monoclonal protein spike but an elevation in the total globulin fraction to 4.4 g/dL.  IFE showed polyclonal increase in immunoglobulins. Kappa lambda free light chain ratio  Patient has been a never smoker.  Patient notes no focal new bone pains.  No fevers no chills no night sweats no unexpected weight loss.  INTERVAL HISTORY  ..I connected with Jacklynn Lewis on 09/24/2022 at  3:30 PM EST by telephone visit and verified that I am speaking with the correct person using two identifiers.   I discussed the limitations, risks, security and privacy concerns of performing an evaluation and management  service by telemedicine and the availability of in-person appointments. I also discussed with the patient that there may be a patient responsible charge related to this service. The patient expressed understanding and agreed to proceed.   Other persons participating in the visit and their role in the encounter: none   Patient's location: home  Provider's location: [facility]   Chief Complaint: Discussion of lab results done for evaluation of abnormal plasma proteins  Patient notes no new symptoms...  Lab results from 09/10/2022 were discussed in details. No new focal bone pains.  No shortness of breath no chest pain.   MEDICAL HISTORY:  Hypertension Dyslipidemia Prediabetes CAD status post  SURGICAL HISTORY: History of coronary disease status post cath  SOCIAL HISTORY: Social History   Socioeconomic History   Marital status: Married    Spouse name: Not on file   Number of children: Not on file   Years of education: Not on file   Highest education level: Not on file  Occupational History   Not on file  Tobacco Use   Smoking status: Never   Smokeless tobacco: Never  Substance and Sexual Activity   Alcohol use: Not on file   Drug use: Not on file   Sexual activity: Not on file  Other Topics Concern   Not on file  Social History Narrative   Not on file   Social Determinants of Health   Financial Resource Strain: Not on file  Food Insecurity: Not on file  Transportation Needs: Not on file  Physical Activity: Not on file  Stress: Not on file  Social Connections: Not on file  Intimate Partner Violence: Not on file    FAMILY HISTORY: No family history on file.  ALLERGIES:  is  allergic to fenofibrate and simvastatin.  MEDICATIONS:  Current Outpatient Medications  Medication Sig Dispense Refill   amLODipine (NORVASC) 10 MG tablet Take 10 mg by mouth daily.     apixaban (ELIQUIS) 5 MG TABS tablet Take 5 mg by mouth 2 (two) times daily.     Ascorbic Acid (VITAMIN C) 100  MG tablet Take by mouth. (Patient not taking: Reported on 09/10/2022)     aspirin EC 81 MG tablet Take by mouth. (Patient not taking: Reported on 09/10/2022)     betamethasone, augmented, (DIPROLENE) 0.05 % gel betamethasone, augmented 0.05 % topical gel (Patient not taking: Reported on 09/10/2022)     cefUROXime (CEFTIN) 500 MG tablet Take 500 mg by mouth 2 (two) times daily. (Patient not taking: Reported on 09/10/2022)     Cholecalciferol (VITAMIN D-1000 MAX ST) 25 MCG (1000 UT) tablet Take by mouth. (Patient not taking: Reported on 09/10/2022)     FLUoxetine (PROZAC) 20 MG capsule Take 20 mg by mouth daily.     leflunomide (ARAVA) 20 MG tablet Take 20 mg by mouth daily. (Patient not taking: Reported on 09/10/2022)     levofloxacin (LEVAQUIN) 500 MG tablet Take 500 mg by mouth daily. (Patient not taking: Reported on 09/10/2022)     losartan-hydrochlorothiazide (HYZAAR) 100-25 MG tablet Take by mouth. (Patient not taking: Reported on 09/10/2022)     metoprolol tartrate (LOPRESSOR) 50 MG tablet metoprolol tartrate 50 mg tablet     naproxen sodium (ALEVE) 220 MG tablet Take by mouth. (Patient not taking: Reported on 09/10/2022)     omeprazole (PRILOSEC) 40 MG capsule Take 40 mg by mouth daily. (Patient not taking: Reported on 09/10/2022)     predniSONE (DELTASONE) 5 MG tablet Take 1 tablet (5 mg total) by mouth daily with breakfast. If tolerated for 7 days may increase dose to 10 mg po daily with breakfast 60 tablet 0   No current facility-administered medications for this visit.    REVIEW OF SYSTEMS:    10 Point review of Systems was done is negative except as noted above.  PHYSICAL EXAMINATION: Telemedicine visit   LABORATORY DATA:  I have reviewed the data as listed  .    Latest Ref Rng & Units 09/10/2022    3:32 PM 03/26/2009    8:10 AM 09/09/2008    8:00 AM  CBC  WBC 4.0 - 10.5 K/uL 8.9  12.2  7.7   Hemoglobin 13.0 - 17.0 g/dL 85.0  27.7  41.2   Hematocrit 39.0 - 52.0 % 44.6  44.4  46.9    Platelets 150 - 400 K/uL 127  127  122     .    Latest Ref Rng & Units 09/10/2022    3:32 PM 12/03/2020   11:19 AM 03/26/2009    8:10 AM  CMP  Glucose 70 - 99 mg/dL 878   676   BUN 8 - 23 mg/dL 14   13   Creatinine 7.20 - 1.24 mg/dL 9.47   0.96   Sodium 283 - 145 mmol/L 137   139   Potassium 3.5 - 5.1 mmol/L 4.0   3.2   Chloride 98 - 111 mmol/L 102   105   CO2 22 - 32 mmol/L 27   26   Calcium 8.9 - 10.3 mg/dL 9.3   8.8   Total Protein 6.5 - 8.1 g/dL 8.9  8.6  6.1   Total Bilirubin 0.3 - 1.2 mg/dL 0.6  0.5  0.8   Alkaline Phos 38 -  126 U/L 94  91  48   AST 15 - 41 U/L 15  17  20    ALT 0 - 44 U/L 12  9  17     Component     Latest Ref Rng 09/10/2022  WBC     4.0 - 10.5 K/uL 8.9   RBC     4.22 - 5.81 MIL/uL 4.87   Hemoglobin     13.0 - 17.0 g/dL   HCT     13/01/2022 - 35.3 % 44.6   MCV     80.0 - 100.0 fL 91.6   MCH     26.0 - 34.0 pg 30.0   MCHC     30.0 - 36.0 g/dL 61.4   RDW     43.1 - 54.0 % 14.0   Platelets     150 - 400 K/uL 127 (L)   nRBC     0.0 - 0.2 % 0.0   Neutrophils     % 64   NEUT#     1.7 - 7.7 K/uL 5.7   Lymphocytes     % 17   Lymphocyte #     0.7 - 4.0 K/uL 1.5   Monocytes Relative     % 6   Monocyte #     0.1 - 1.0 K/uL 0.6   Eosinophil     % 12   Eosinophils Absolute     0.0 - 0.5 K/uL 1.0 (H)   Basophil     % 1   Basophils Absolute     0.0 - 0.1 K/uL 0.1   Immature Granulocytes     % 0   Abs Immature Granulocytes     0.00 - 0.07 K/uL 0.01   Sodium     135 - 145 mmol/L 137   Potassium     3.5 - 5.1 mmol/L 4.0   Chloride     98 - 111 mmol/L 102   CO2     22 - 32 mmol/L 27   Glucose     70 - 99 mg/dL 08.6 (H)   BUN     8 - 23 mg/dL 14   Creatinine     76.1 - 1.24 mg/dL 950   Calcium     8.9 - 10.3 mg/dL 9.3   Total Protein     6.5 - 8.1 g/dL 8.9 (H)   Albumin     3.5 - 5.0 g/dL 4.0   AST     15 - 41 U/L 15   ALT     0 - 44 U/L 12   Alkaline Phosphatase     38 - 126 U/L 94   Total Bilirubin     0.3 - 1.2 mg/dL  0.6   GFR, Est Non African American     >60 mL/min >60   Anion gap     5 - 15  8   IgG (Immunoglobin G), Serum     603 - 1,613 mg/dL 9.32 (H)   IgA     61 - 437 mg/dL 6.71 (H)   IgM (Immunoglobulin M), Srm     15 - 143 mg/dL 2,458 (H)   Total Protein ELP     6.0 - 8.5 g/dL 8.0 (C)  Albumin SerPl Elph-Mcnc     2.9 - 4.4 g/dL 3.4 (C)  Alpha 1     0.0 - 0.4 g/dL 0.3 (C)  Alpha2 Glob SerPl Elph-Mcnc     0.4 - 1.0 g/dL 0.9 (  C)  B-Globulin SerPl Elph-Mcnc     0.7 - 1.3 g/dL 1.5 (H) (C)  Gamma Glob SerPl Elph-Mcnc     0.4 - 1.8 g/dL 1.9 (H) (C)  M Protein SerPl Elph-Mcnc     Not Observed g/dL Not Observed (C)  Globulin, Total     2.2 - 3.9 g/dL 4.6 (H) (C)  Albumin/Glob SerPl     0.7 - 1.7  0.8 (C)  IFE 1 Comment ! (C)  Please Note (HCV): Comment (C)  Kappa free light chain     3.3 - 19.4 mg/L 115.8 (H)   Lambda free light chains     5.7 - 26.3 mg/L 84.2 (H)   Kappa, lambda light chain ratio     0.26 - 1.65  1.38   Vitamin D, 25-Hydroxy     30 - 100 ng/mL 38.24   Beta-2 Microglobulin     0.6 - 2.4 mg/L 3.4 (H)   LDH     98 - 192 U/L 199 (H)   RA Latex Turbid.     <14.0 IU/mL 502.6 (H)   CRP     <1.0 mg/dL 3.0 (H)   Sed Rate     0 - 16 mm/hr 60 (H)     Legend: (L) Low (H) High ! Abnormal (C) Corrected  RADIOGRAPHIC STUDIES: I have personally reviewed the radiological images as listed and agreed with the findings in the report. No results found.  ASSESSMENT & PLAN:   Patient is an 81 year old very pleasant gentleman with history of hypertension, prediabetes, dyslipidemia with  #1 hypergammaglobinemia previous SPEP did not show an M spike.  Serum kappa lambda free light chain ratio within normal limits Likely related to significant inflammation from untreated rheumatoid arthritis  Plan Discussed all available results from 09/10/2022 in detail with the patient. SPEP with IFE shows polyclonal hypergammaglobulinemia and no monoclonal paraproteinemia to suggest a  plasma cell disorder. Patient's rheumatoid factor is significantly elevated at 502 with a sedimentation rate of 60 suggesting significant inflammation from his rheumatoid arthritis which is likely cause of his polyclonal hypergammaglobulinemia. Would recommend primary care physician reconnect the patient with his rheumatologist for continued management of his RA Vitamin D levels at 38.24.  Would recommend optimizing to maintain his vitamin D levels between 60 and 90. No indication for additional hematologic workup at this time for the patient's polyclonal hypergammaglobulinemia  Follow-up Return to clinic with PCP  The total time spent in the appointment was 10 minutes*.  All of the patient's questions were answered with apparent satisfaction. The patient knows to call the clinic with any problems, questions or concerns.   Wyvonnia Lora MD MS AAHIVMS [facility] Hematology/Oncology Physician [facility]  .*Total Encounter Time as defined by the Centers for Medicare and Medicaid Services includes, in addition to the face-to-face time of a patient visit (documented in the note above) non-face-to-face time: obtaining and reviewing outside history, ordering and reviewing medications, tests or procedures, care coordination (communications with other health care professionals or caregivers) and documentation in the medical record.

## 2022-10-29 DIAGNOSIS — D472 Monoclonal gammopathy: Secondary | ICD-10-CM | POA: Diagnosis not present

## 2022-10-29 DIAGNOSIS — M069 Rheumatoid arthritis, unspecified: Secondary | ICD-10-CM | POA: Diagnosis not present

## 2022-10-29 DIAGNOSIS — M545 Low back pain, unspecified: Secondary | ICD-10-CM | POA: Diagnosis not present

## 2023-05-05 DIAGNOSIS — M069 Rheumatoid arthritis, unspecified: Secondary | ICD-10-CM | POA: Diagnosis not present

## 2023-05-05 DIAGNOSIS — I4891 Unspecified atrial fibrillation: Secondary | ICD-10-CM | POA: Diagnosis not present

## 2023-05-05 DIAGNOSIS — I10 Essential (primary) hypertension: Secondary | ICD-10-CM | POA: Diagnosis not present

## 2023-05-05 DIAGNOSIS — K219 Gastro-esophageal reflux disease without esophagitis: Secondary | ICD-10-CM | POA: Diagnosis not present

## 2023-05-05 DIAGNOSIS — Z7901 Long term (current) use of anticoagulants: Secondary | ICD-10-CM | POA: Diagnosis not present

## 2023-05-05 DIAGNOSIS — H9193 Unspecified hearing loss, bilateral: Secondary | ICD-10-CM | POA: Diagnosis not present

## 2023-05-05 DIAGNOSIS — I2510 Atherosclerotic heart disease of native coronary artery without angina pectoris: Secondary | ICD-10-CM | POA: Diagnosis not present

## 2023-05-08 NOTE — Progress Notes (Signed)
 Assessment/Plan   Diagnoses and all orders for this visit:  Primary hypertension Stable overall. Well controlled overall. Continue current regimen. -     CBC w/ Differential; Future -     Basic Metabolic Panel; Future -     Hepatic Function Panel; Future -     Lipid Panel; Future -     Thyroid  Function Cascade; Future -     amlodipine (NORVASC) 10 MG tablet; Take 1 tablet (10 mg total) by mouth daily. -     losartan-hydroCHLOROthiazide (HYZAAR) 100-25 mg per tablet; Take 1 tablet by mouth daily. -     Discontinue: metoPROLOL tartrate (LOPRESSOR) 50 MG tablet; Take 1 tablet (50 mg total) by mouth two (2) times a day. -     metoPROLOL tartrate (LOPRESSOR) 25 MG tablet; Take 1 tablet (25 mg total) by mouth two (2) times a day. -     T4, Free -     Thyroid  Peroxidase Antibody  Atrial fibrillation, unspecified type (CMS-HCC), Long term (current) use of anticoagulants Clinically asymptomatic. Heart rate in the 50s on metoprolol 50 mg BID. Standard education and anticipatory guidance regarding goals, objectives, and sequelae. Given low heart rate on 50 mg twice daily of metoprolol will decrease to 25 mg twice daily and monitor.   Otherwise continue current management and plan of care. -     CBC w/ Differential; Future -     Basic Metabolic Panel; Future -     Hepatic Function Panel; Future -     Thyroid  Function Cascade; Future -     apixaban  (ELIQUIS ) 5 mg Tab; Take 1 tablet (5 mg total) by mouth two (2) times a day. -     Ambulatory referral to Cardiology; Future -     ECG 12 Lead -     T4, Free -     Thyroid  Peroxidase Antibody  Coronary artery disease involving native coronary artery of native heart without angina pectoris Clinically asymptomatic.  Standard education and anticipatory guidance regarding goals, objectives, and sequelae. Continue current management and plan of care. Continue aggressive risk factor reduction. -     CBC w/ Differential; Future -     Basic Metabolic  Panel; Future -     Hepatic Function Panel; Future -     Lipid Panel; Future -     Thyroid  Function Cascade; Future -     nitroglycerin  (NITROSTAT ) 0.4 MG SL tablet; Place 1 tablet (0.4 mg total) under the tongue every five (5) minutes as needed. -     ECG 12 Lead -     T4, Free -     Thyroid  Peroxidase Antibody  Rheumatoid arthritis, involving unspecified site, unspecified whether rheumatoid factor present (CMS-HCC) Ongoing pain without clear active inflammation. Standard education and anticipatory guidance regarding goals, objectives, and sequelae. Conservative measures reviewed. -     CBC w/ Differential; Future -     Basic Metabolic Panel; Future -     Hepatic Function Panel; Future -     Thyroid  Function Cascade; Future -     Rheumatoid Factor, Quantitative; Future -     Cyclic Citrul Peptide Antibody, IgG; Future -     Anti-Nuclear Antibody (ANA); Future -     Uric acid; Future -     Sedimentation Rate; Future -     C-reactive protein; Future -     Ambulatory referral to Rheumatology; Future -     T4, Free -     Thyroid  Peroxidase Antibody  Gastroesophageal reflux disease, unspecified whether esophagitis present Stable at present. Continue current plan of care. -     omeprazole (PRILOSEC) 40 MG capsule; Take 1 capsule (40 mg total) by mouth daily.  Decreased hearing of both ears Standard education and anticipatory guidance regarding goals, objectives, and sequelae. Referral to audiology. -     Ambulatory referral to Audiology; Future  Mood disorder (CMS-HCC) Stable overall. Well controlled overall. Continue current regimen. -     FLUoxetine  (PROZAC ) 20 MG capsule; Take 1 capsule (20 mg total) by mouth daily.  Disposition: Return in about 4 weeks (around 06/02/2023) for Recheck, Atrial Fibrillation, Hypertension, Arthralgia.  05/06/2023   Diagnoses and plan along with any newly prescribed medication(s), recommendations, orders and therapies were discussed in detail with  patient today.  Standard education and anticipatory guidance regarding goals, objectives and potential sequelae were reviewed. Patient voiced appropriate understanding of assessment and plan of care.  Pertinent handouts were given today and reviewed with the patient as indicated.  The Care Plan and Self-Management goals have been included on the AVS and the AVS has been printed.  Where helpful for monitoring and management I have encouraged the patient to keep regular logs for me to review at their next visit. Any outside resources or referrals needed at this time are noted above. No additional referrals necessary at this time. Patient voiced understanding and all questions have been answered to satisfaction.   To complete this documentation in a timely manner, portions of this encounter may have been completed utilizing Dragon voice recognition software. Please take this into consideration in noting nuances of documentation, grammar, punctuation, and wording. If and when a question exists, or for clarification, please contact the author.  [name], MD [facility] Primary Care at [facility] [address], [address] [place], [place] [address] Phone [phone]  Subjective:   ERE:Hpadnw, Alm Lever, MD  Darrin Koman, is a 82 y.o. male  Chief Complaint  Patient presents with  . Establish Care    Patient states that he has pain all over. Patient states that he has Afib, but denies having any recent chest pain or irregular heartbeat     HPI:  Patient presents: with wife.  Nursing notes and history reviewed with patient and wife.  New patient to practice.  Hypertension. Blood pressure goal < 140/90. Blood pressures have been well controlled. Compliant with medications. Denies side-effects. No end organ symptoms. No chest pain, sob, orthopnea or pnd.  Atrial fibrillation.  Denies recent concerning related symptoms.  Compliant with Eliquis  without side effects.  Currently on metoprolol 50 mg  twice daily.  Heart rate has been in the 50s.  No palpitations dizziness presyncope or syncope.  Compliant with Eliquis  anticoagulation without side effects.  No bleeding difficulties.    Coronary artery disease.  Compliant with therapy. No chest pain, shortness of breath, orthopnea, PND.  No increase nitroglycerin  use.  No bleeding complications from aspirin  therapy noted.  Rheumatoid arthritis.  Stable joint changes continues with ulnar deviation.  No erythema warmth or swelling.  Other than acetaminophen  no significant as needed analgesia use.  Gastroesophageal reflux disease. Doing well with proton pump inhibitor. Notices significant benefit when using. Symptomatic when not using.   Decreased hearing both ears.  No acute changes.  Opts to see audiology.  Mood disorder.  Been stable for several years on his fluoxetine  20 mg daily.  Denies side effects or complication with use.  No other complaints. No additional recent health care visits  elsewhere. No urgent care or ED visits. No additional palliative, provocative, modifying, quantifying, qualifying, or other related factors. Unless otherwise outlined above patient is pleased with current medication regimen, admits compliance and denies side effects of medication.  The following portions of the patient's history were reviewed and updated as appropriate: allergies, current medications, past family history, past medical history, past social history, past surgical history and problem list.  ROS: Pertinent positives and negatives are as outlined in the HPI above. The remaining balance of the 12 point review of systems is otherwise benign.   PCMH Components:    Goals   None     Family characteristics, patient's social characteristics, patient's cultural characteristics, patient's communication needs, patient's health literacy and behaviors affecting patient's health are outlined under the patient's History Section, Longitudinal Care Plan  or Problem List in Epic or as pertinent in History of Present Illness.  Barriers to goals identified and addressed.  See Assessment and Plan for additional pertinent details.  Objective:   Physical Exam  BP 138/72   Pulse 52   Temp 36.4 C (97.5 F) (Oral)   Resp 16   Ht 188 cm (6' 2.02)   Wt 68.3 kg (150 lb 8 oz)   SpO2 97%   BMI 19.31 kg/m  Constitutional: Oriented to person, place, and time. Appears well-developed and well-nourished. No distress.  Head: Normocephalic and atraumatic.  Ears/Nose/Mouth/Throat: EACs and TMs are benign. Nares is patent without noted abnormality. Oropharynx is without mass or concerning lesions. Moist mucous membranes. Eyes: Conjunctivae are normal. No scleral icterus.  Neck: Neck supple. Normal range of motion. No JVD present. No thyromegaly present. No concerning neck mass.  Cardiovascular: Normal rate. Regular rhythm. Normal heart sounds. No murmur heard. Pulmonary/Chest: Effort normal. No respiratory distress. Breath sounds normal. No wheezes. No rhonchi. No rales. No focal breath sound changes. No chest tenderness. Abdominal: Soft. Non-distended. No abdominal mass. No tenderness. No rebound. No guarding.  Musculoskeletal:  Ulnar deviation of the metacarpophalangeal joints of bilateral hands.   No active synovitis. No concerning joint erythema, warmth or swelling.  Range of motion is otherwise without evidence of significant impairment.  Strength is without evidence of significant impairment.  No edema of the extremities.  Lymphadenopathy: There is no lymphadenopathy about the head or neck. Neurological: Alert and oriented to person, place, and time. No cranial nerve deficit. Normal muscle tone. Normal coordination. Skin: Skin is warm and dry. No pallor. No rash noted.  Psychiatric: Normal mood, affect and behavior. Judgment and thought content normal.

## 2023-06-03 DIAGNOSIS — H9193 Unspecified hearing loss, bilateral: Secondary | ICD-10-CM | POA: Diagnosis not present

## 2023-06-14 DIAGNOSIS — R001 Bradycardia, unspecified: Secondary | ICD-10-CM | POA: Diagnosis not present

## 2023-06-14 DIAGNOSIS — I10 Essential (primary) hypertension: Secondary | ICD-10-CM | POA: Diagnosis not present

## 2023-06-14 DIAGNOSIS — I77810 Thoracic aortic ectasia: Secondary | ICD-10-CM | POA: Diagnosis not present

## 2023-06-14 DIAGNOSIS — Z7901 Long term (current) use of anticoagulants: Secondary | ICD-10-CM | POA: Diagnosis not present

## 2023-06-14 DIAGNOSIS — I4891 Unspecified atrial fibrillation: Secondary | ICD-10-CM | POA: Diagnosis not present

## 2023-06-21 DIAGNOSIS — M069 Rheumatoid arthritis, unspecified: Secondary | ICD-10-CM | POA: Diagnosis not present

## 2023-06-21 DIAGNOSIS — I2510 Atherosclerotic heart disease of native coronary artery without angina pectoris: Secondary | ICD-10-CM | POA: Diagnosis not present

## 2023-06-21 DIAGNOSIS — M720 Palmar fascial fibromatosis [Dupuytren]: Secondary | ICD-10-CM | POA: Diagnosis not present

## 2023-06-21 DIAGNOSIS — I4891 Unspecified atrial fibrillation: Secondary | ICD-10-CM | POA: Diagnosis not present

## 2023-06-21 DIAGNOSIS — I10 Essential (primary) hypertension: Secondary | ICD-10-CM | POA: Diagnosis not present

## 2023-06-21 DIAGNOSIS — Z0000 Encounter for general adult medical examination without abnormal findings: Secondary | ICD-10-CM | POA: Diagnosis not present

## 2023-07-18 DIAGNOSIS — I081 Rheumatic disorders of both mitral and tricuspid valves: Secondary | ICD-10-CM | POA: Diagnosis not present

## 2023-07-18 DIAGNOSIS — I4821 Permanent atrial fibrillation: Secondary | ICD-10-CM | POA: Diagnosis not present

## 2023-07-18 DIAGNOSIS — I4891 Unspecified atrial fibrillation: Secondary | ICD-10-CM | POA: Diagnosis not present

## 2023-07-18 DIAGNOSIS — I77810 Thoracic aortic ectasia: Secondary | ICD-10-CM | POA: Diagnosis not present

## 2023-08-18 DIAGNOSIS — Z7901 Long term (current) use of anticoagulants: Secondary | ICD-10-CM | POA: Diagnosis not present

## 2023-08-18 DIAGNOSIS — I10 Essential (primary) hypertension: Secondary | ICD-10-CM | POA: Diagnosis not present

## 2023-08-18 DIAGNOSIS — I4891 Unspecified atrial fibrillation: Secondary | ICD-10-CM | POA: Diagnosis not present

## 2023-08-18 DIAGNOSIS — I77810 Thoracic aortic ectasia: Secondary | ICD-10-CM | POA: Diagnosis not present

## 2023-08-18 DIAGNOSIS — R001 Bradycardia, unspecified: Secondary | ICD-10-CM | POA: Diagnosis not present

## 2023-08-18 NOTE — Progress Notes (Signed)
 [facility] CARDIOLOGY   [facility] [address] [place], [place] [address] Ph: [phone] Fax: [phone] Specialty Care at [facility] [address], [address] [place], [place] [address]  Ph: [phone] Fax: [phone]   Date of Service: 08/18/2023  PCP: Baldwin Alm Lever, MD Referring Provider: Alm Lever Baldwin, MD  ASSESSMENT and PLAN:   Jackson Lawson is a 82 y.o. male presenting at the request of Alm Lever Baldwin, MD for evaluation and management of: 1. Atrial fibrillation, unspecified type (CMS-HCC)   2. Ascending aorta dilatation (CMS-HCC)   3. Primary hypertension   4. Chronic anticoagulation   5. Bradycardia    Jackson Lawson presents in follow-up.  I first met him August 2024. He has a history of HTN, afib without attempts at rhythm control, and rheumatoid arthritis. He was previously seen at [facility]. He denies any palpitations but noted frequent dizziness.  His ECG showed A-fib with heart rate in the 50s.  We stopped metoprolol at his last visit.  This seems to have helped.  His wife feels like he is doing better and going outside more to do things in the garden.  We also obtained an echocardiogram.  This showed normal LV and RV function.  He does have mild to moderate tricuspid regurgitation.  The ascending aorta is mildly dilated at 4.2 cm.  For today we will not make any medication changes.  His blood pressure is well-controlled off metoprolol and his heart rate is not high and he has no symptoms of A-fib. - continue eliquis  for stroke risk reduction, CHADS2-Vasc score is 3 (age+2, HTN). Discussed falls risk, bleeding risk. - continue losartan-hydrochlorothiazide, amlodipine for BP - stopped metoprolol due to dizziness/bradycardia => improved  Follow-up: Return in about 6 months (around 02/16/2024) for follow up with [facility]  Liza Beaver, MD, PhD [facility]  ORDERS THIS VISIT:   New Prescriptions   No medications on file     Modified Medications   No medications on file    Discontinued Medications   No medications on file    No orders of the defined types were placed in this encounter.   SUBJECTIVE:   ID: Jackson Lawson is a 82 y.o. male with a history of Afib  Reason for Visit: Follow-up   Cardiovascular History:  Previously seen at [facility] for history of paroxysmal Afib  Interval History:  Dizziness is better. Does more things outside now.  No heart racing. No palpitations. No chest pain.    Review of Systems 10 systems were reviewed and negative except as noted in HPI.  Cardiovascular History and Pertinent Past Medical History: Afib HTN Rheumatoid arthritis   has a past medical history of A-fib (CMS-HCC), Arthritis, and Hypertension.  Cardiovascular Interventions / Surgery:  none  Cardiovascular Diagnostics:  ECG 05/05/23 afib with HR 56 bpm, LAFB, RBBB 06/14/23 afib, 54 bpm, RBBB  Ambulatory Monitor  Echo 07/2023 normal EF, normal RV, mildly dilated LA/RA, mild-mod TR, ascending aorta is 4.2cm with index 2.3cm/m2  SPECT Stress  ETT  LHC/RHC  CT imaging  Other Surgical History:  has no past surgical history on file.  Current Outpatient Medications  Medication Instructions  . amlodipine (NORVASC) 10 mg, Oral, Daily (standard)  . ascorbic acid (vitamin C) (VITAMIN C) 100 mg, Oral  . cholecalciferol (vitamin D3 25 mcg (1,000 units)) 25 mcg, Oral  . ELIQUIS  5 mg, Oral  . FLUoxetine  (PROZAC ) 20 mg, Oral, Daily (standard)  . losartan-hydroCHLOROthiazide (HYZAAR) 100-25 mg per  tablet 1 tablet, Oral, Daily (standard)  . multivitamin (TAB-A-VITE/THERAGRAN) per tablet 1 tablet, Oral, Daily (standard)  . nitroglycerin  (NITROSTAT ) 0.4 mg, Sublingual, Every 5 min PRN  . omeprazole (PRILOSEC) 40 mg, Oral, Daily (standard)     Allergies: is allergic to fenofibrate and simvastatin.  Social History: He  reports that he has never smoked. He has never been exposed to tobacco smoke.  He has never used smokeless tobacco. He reports that he does not drink alcohol and does not use drugs.  Family History: His family history includes Heart disease in his mother.    OBJECTIVE:    BP 130/70 (BP Site: R Arm, BP Position: Sitting, BP Cuff Size: Medium)   Pulse 94   Temp 37.1 C (98.8 F) (Temporal)   Resp 18   Ht 188 cm (6' 2.02)   Wt 67.2 kg (148 lb 3.2 oz)   SpO2 99%   BMI 19.02 kg/m   Wt Readings from Last 3 Encounters:  08/18/23 67.2 kg (148 lb 3.2 oz)  06/21/23 67.6 kg (149 lb)  06/21/23 67.6 kg (149 lb)   BP Readings from Last 5 Encounters:  08/18/23 130/70  06/21/23 122/80  06/21/23 122/80  06/14/23 144/80  05/05/23 138/72   Pulse Readings from Last 3 Encounters:  08/18/23 94  06/21/23 76  06/21/23 76   General:  Alert, no distress.  Eyes:  EOMI, sclerae anicteric  Neck: No carotid bruit. JVP is not elevated when examined sitting upright  Resp:   CTAB bilaterally with normal WOB.  CV:  Irregularly irregular, normal s1 and s2, no murmurs, normal radial pulses b/l, no LE edema  GI:   Abdomen soft, non-tender  MSK: Normal muscle tone, no joint swelling/effusion  Skin: No lesions/rashes.  Neuro: No focal deficits.  Psych: Normal mood, normal affect    Lab Results  Component Value Date   HGB 15.6 05/05/2023   PLT 93 (L) 05/05/2023   Lab Results  Component Value Date   CREATININE 0.90 05/05/2023   K 4.1 05/05/2023   No results found for: BNP No results found for: PROBNP Lab Results  Component Value Date   CHOL 145 05/05/2023   LDL 67 05/05/2023   HDL 56 05/05/2023   TRIG 109 05/05/2023   No results found for: A1C

## 2023-09-21 DIAGNOSIS — Z9181 History of falling: Secondary | ICD-10-CM | POA: Diagnosis not present

## 2023-09-21 DIAGNOSIS — I4891 Unspecified atrial fibrillation: Secondary | ICD-10-CM | POA: Diagnosis not present

## 2023-09-21 DIAGNOSIS — M069 Rheumatoid arthritis, unspecified: Secondary | ICD-10-CM | POA: Diagnosis not present

## 2023-09-21 DIAGNOSIS — I10 Essential (primary) hypertension: Secondary | ICD-10-CM | POA: Diagnosis not present

## 2023-09-21 DIAGNOSIS — I2510 Atherosclerotic heart disease of native coronary artery without angina pectoris: Secondary | ICD-10-CM | POA: Diagnosis not present

## 2023-09-21 DIAGNOSIS — Z7901 Long term (current) use of anticoagulants: Secondary | ICD-10-CM | POA: Diagnosis not present

## 2023-09-21 DIAGNOSIS — Z23 Encounter for immunization: Secondary | ICD-10-CM | POA: Diagnosis not present

## 2023-12-27 NOTE — Progress Notes (Signed)
 Assessment/Plan   Diagnoses and all orders for this visit:  Thrombocytopenia (CMS-HCC) Unclear specific etiology. Standard education and anticipatory guidance regarding goals, objectives, and sequelae. Precautions reviewed. Ultrasound to evaluate liver spleen and other possible concerning pathology. -     US  ABD COMP -LIVER PANC GB BILE DUCT AORTA PROX IVC KIDNEY SPLN; Future  Rheumatoid arthritis, involving unspecified site, unspecified whether rheumatoid factor present (CMS-HCC) Evidence of clinical joint abnormalities. Standard education guidance. Continue anticipated plan for consultation with rheumatology.  Abnormal breath sounds Unclear particular etiology. No clear evidence of volume overload or pneumonia. Standard education guidance. Obtain chest x-ray. -     XR Chest 2 views; Future -     US  ABD COMP -LIVER PANC GB BILE DUCT AORTA PROX IVC KIDNEY SPLN; Future  Primary hypertension Stable overall. Well controlled overall. Continue current regimen. -     XR Chest 2 views; Future  Coronary artery disease involving native coronary artery of native heart without angina pectoris Clinically asymptomatic.  Standard education and anticipatory guidance regarding goals, objectives, and sequelae. Continue current management and plan of care. Continue aggressive risk factor reduction.  Atrial fibrillation, unspecified type (CMS-HCC), Long term (current) use of anticoagulants Stable overall. Well controlled overall. Continue current regimen.  Risk for falls Standard education and anticipatory guidance regarding goals, objectives, and sequelae. Fall precautions reviewed.  Elevated bilirubin Noted on lab review.  Unclear particular etiology.  Noted in the setting of thrombocytopenia. Cannot exclude possibility of liver disease. Standard education guidance. Precautions. Ultrasound of the abdomen particular looking at liver and spleen as outlined below. -     US  ABD COMP  -LIVER PANC GB BILE DUCT AORTA PROX IVC KIDNEY SPLN; Future   Disposition: Return in about 6 months (around 06/20/2024) for Recheck Thrombocytopenia, Hypertension.  Visit date not found   Diagnoses and plan along with any newly prescribed medication(s), recommendations, orders and therapies were discussed in detail with patient today.  Standard education and anticipatory guidance regarding goals, objectives and potential sequelae were reviewed. Patient voiced appropriate understanding of assessment and plan of care.  Pertinent handouts were given today and reviewed with the patient as indicated.  The Care Plan and Self-Management goals have been included on the AVS and the AVS has been printed.  Where helpful for monitoring and management I have encouraged the patient to keep regular logs for me to review at their next visit. Any outside resources or referrals needed at this time are noted above. No additional referrals necessary at this time. Patient voiced understanding and all questions have been answered to satisfaction.   To complete this documentation in a timely manner, portions of this encounter may have been completed utilizing Dragon voice recognition software. Please take this into consideration in noting nuances of documentation, grammar, punctuation, and wording. If and when a question exists, or for clarification, please contact the author.  [name], MD [facility] Primary Care at [facility] [address] [place], [place] [address] Phone [phone]  Subjective:   ERE:Hpadnw, Alm Lever, MD  Jackson Lawson, is a 83 y.o. male  Chief Complaint  Patient presents with  . Hypertension    Patient states he does not check blood pressure. Patient denies headaches, dizziness, chest pain.   . Atrial Fibrillation    Patient denies having any irregular heartbeat or shortness of breath   . Rheumatid Arthritis    Patient states he does still have joint pain.      HPI:  Patient presents:  Alone.  Nursing notes and history reviewed with patient.  Gardner presents in setting of lab abnormalities including thrombocytopenia hyperbilirubinemia in the setting of rheumatoid arthritis hypertension CAD and atrial fibrillation on long-term anticoagulant therapy.  He reports he is doing overall well at present.  Thrombocytopenia.  Noted on lab review.  No bleeding difficulties noted.  No easy bruising.  No hematuria.  No hemoptysis.  Rheumatoid arthritis.  Noted previously.  Referred to rheumatology.  Appointment is out with a prolonged wait time.  No erythema warmth or swelling of the joints.  Abnormal breath sounds on exam today.  Denies shortness of breath.  Mobility is not great.  No concerning cough orthopnea or PND.  Hypertension. Blood pressure goal < 140/90. Blood pressures have been well controlled. Compliant with medications. Denies side-effects. No end organ symptoms. No chest pain, sob, orthopnea or pnd.  Coronary artery disease. Compliant with therapy. No chest pain, shortness of breath, orthopnea, PND.  No increase nitroglycerin  use.  No bleeding complications from aspirin  therapy noted.  Atrial fibrillation long-term anticoagulant therapy. Compliant with Eliquis  anticoagulation regimen. No bleeding complications from therapy. No chest pain, palpitations, shortness of breath, orthopnea, PND, or dizziness.  Risk for falls with some falls with feet getting caught up on steps.  No concerning energy use related to fall recently.  Hyperbilirubinemia.  Noted on lab review.  No change in urine stool or sclera color.  No abdominal swelling.  No other complaints. No additional recent health care visits elsewhere. No urgent care or ED visits. No additional palliative, provocative, modifying, quantifying, qualifying, or other related factors. Unless otherwise outlined above patient is pleased with current medication regimen, admits compliance and denies  side effects of medication.  The following portions of the patient's history were reviewed and updated as appropriate: allergies, current medications, past family history, past medical history, past social history, past surgical history and problem list.  ROS: Pertinent positives and negatives are as outlined in the HPI above. The remaining balance of the 12 point review of systems is otherwise benign.   PCMH Components:    Goals     . Check blood pressure regularly    . Exercise 3x per week (30 min per time)     June 21, 2023 11:22 AM  AM AWV Goal  Things to think about to help me reach my goal:   What are you going to do? Exercise  How and how much? 30 minutes  How frequent? 3 x week  Barriers to success? None identified  Solutions to barriers? n/a           Family characteristics, patient's social characteristics, patient's cultural characteristics, patient's communication needs, patient's health literacy and behaviors affecting patient's health are outlined under the patient's History Section, Longitudinal Care Plan or Problem List in Epic or as pertinent in History of Present Illness.  Barriers to goals identified and addressed.  See Assessment and Plan for additional pertinent details.  Objective:   Physical Exam  BP 122/74 (BP Site: R Arm)   Pulse 60   Temp 36.7 C (98 F) (Oral)   Resp 18   Ht 188 cm (6' 2.02)   Wt 70.8 kg (156 lb)   SpO2 99%   BMI 20.02 kg/m  Constitutional: Oriented to person, place, and time. Appears well-developed and well-nourished. No distress.  Head: Normocephalic and atraumatic.  Mouth/Throat: Oropharynx is without mass or concerning lesions. Moist mucous membranes.   Eyes: Conjunctivae are normal. No scleral icterus.  Neck:  Neck supple. Normal range of motion. No JVD present. No thyromegaly present. No concerning neck mass.  Cardiovascular: Normal rate. Regular rhythm. Normal heart sounds. No murmur heard. Pulmonary/Chest: Effort  normal. No respiratory distress.  Fine rales bilateral bases.  Otherwise breath sounds are without noted abnormality throughout.  No wheezes. No rhonchi.  No chest tenderness. Abdominal: Soft. Non-distended. No abdominal mass. No tenderness. No rebound. No guarding.  Musculoskeletal: No active synovitis. No concerning joint erythema, warmth or swelling.  Ulnar deviation of the fingers at the MCP joints. There is reduced ROM of the lumbar spine with paraspinal muscle spasm. Range of motion is otherwise without evidence of significant impairment.  Strength is without evidence of significant impairment.  No edema of the extremities.  Lymphadenopathy: There is no lymphadenopathy about the head or neck. Neurological: Alert and oriented to person, place, and time. No cranial nerve deficit. Normal muscle tone. Normal coordination. Skin: Skin is warm and dry. No pallor. No rash noted.  Psychiatric: Normal mood, affect and behavior. Judgment and thought content normal.

## 2024-02-17 NOTE — Progress Notes (Signed)
 ESTABLISHED PATIENT VISIT  Date of Encounter: 02/17/2024 Patient Name: Jackson Lawson 06/08/1941 83 y.o.  Primary Care Provider:  Baldwin Alm Lever, MD Primary Cardiologist: Dr. Margaree    Reason for visit: Jackson Lawson  is a 83 y.o. male who is seen in follow up for A.Fib.    ASSESSMENT/PLAN .  He is doing well without cardiac complaints. He has chronic A.Fib that is rate controlled without AV nodal blocking agents. No bleeding issues on Eliquis , but he does fall frequently. He attributes this to significant arthritis pain in his feet. He has a cane, but doesn't use it regularly. Discussed risk of being on Eliquis  with frequent falls. Will refer for consideration of Watchman. In the meantime he will try to use cane and be more careful.   1. Longstanding persistent atrial fibrillation (Primary) No attempts at rhythm control in the past. Normal LVEF and mild BAE by echo 07/2023. BB stopped in the past for dizziness/bradycardia. Anticoagulated on Eliquis .   2. Long term (current) use of anticoagulants CHADS2-Vasc score is 3 (age+2, HTN). Tolerating Eliquis . No bleeding issues. He is a high falls risk. Consider Watchman.  3. Ascending aorta dilatation Ascending aorta is 4.2cm by echo 07/2023. BP well controlled on ARB. We will continue to monitor clinically and with serial echos as indicated   4. Primary hypertension BP 130/80  today. Goal BP <130/80.  Currently on amlodipine 10mg  daily and losartan-hydrochlorothiazide 100/25mg  daily.  Disposition: Return to clinic in 6mos.  Harlene LABOR Hope, PA-C 02/17/2024, 10:18 AM  Accessory Clinical Findings . I independently reviewed cardiology notes, echo, PCP notes, EKG   EKG today shows A.Fib 76bpm, RBBB w/ LAFB  History of Present Illness  Jackson Lawson is a 83 y.o. male with PMHx as below. He was last evaluated in clinic in October. He is feeling well. He reports falling a lot because of getting tangled up in his feet. He has bad  arthritis which causes pain in his feet and sometimes makes it difficult to walk. No melena/hematochezia or hematuria.    Past Medical History  Past Medical History:  Diagnosis Date  . A-fib   . Arthritis   . Hypertension    No past surgical history on file.  Current Allergy List Allergies  Allergen Reactions  . Fenofibrate Diarrhea  . Simvastatin Diarrhea    Current Medication List Current Outpatient Medications  Medication Sig Dispense Refill  . amlodipine (NORVASC) 10 MG tablet TAKE 1 TABLET BY MOUTH DAILY 80 tablet 2  . apixaban  (ELIQUIS ) 5 mg Tab TAKE 1 TABLET BY MOUTH TWICE  DAILY 200 tablet 1  . ascorbic acid, vitamin C, (VITAMIN C) 100 MG tablet Take 1 tablet (100 mg total) by mouth.    . cholecalciferol, vitamin D3 25 mcg, 1,000 units,, 1,000 unit (25 mcg) tablet Take 1 tablet (25 mcg total) by mouth.    . FLUoxetine  (PROZAC ) 20 MG capsule TAKE 1 CAPSULE BY MOUTH DAILY 100 capsule 1  . losartan-hydroCHLOROthiazide (HYZAAR) 100-25 mg per tablet Take 1 tablet by mouth daily. 90 tablet 1  . multivitamin (TAB-A-VITE/THERAGRAN) per tablet Take 1 tablet by mouth daily.    . nitroglycerin  (NITROSTAT ) 0.4 MG SL tablet Place 1 tablet (0.4 mg total) under the tongue every five (5) minutes as needed. 25 tablet 2  . omeprazole (PRILOSEC) 40 MG capsule TAKE 1 CAPSULE BY MOUTH DAILY 100 capsule 2   No current facility-administered medications for this visit.     Family  History  Family History  Problem Relation Age of Onset  . Heart disease Mother   . Drug abuse Neg Hx   . Mental illness Neg Hx     Social History  Social History   Socioeconomic History  . Marital status: Married    Spouse name: Clarita  . Number of children: 3  Occupational History  . Occupation: Retired  Tobacco Use  . Smoking status: Never    Passive exposure: Never  . Smokeless tobacco: Never  Vaping Use  . Vaping status: Never Used  Substance and Sexual Activity  . Alcohol use: Never  . Drug  use: Never  . Sexual activity: Not Currently    Partners: Female   Social Drivers of Health   Financial Resource Strain: Low Risk  (06/21/2023)   Overall Financial Resource Strain (CARDIA)   . Difficulty of Paying Living Expenses: Not hard at all  Food Insecurity: No Food Insecurity (06/21/2023)   Hunger Vital Sign   . Worried About Programme researcher, broadcasting/film/video in the Last Year: Never true   . Ran Out of Food in the Last Year: Never true  Transportation Needs: No Transportation Needs (06/21/2023)   PRAPARE - Transportation   . Lack of Transportation (Medical): No   . Lack of Transportation (Non-Medical): No  Physical Activity: Inactive (06/21/2023)   Exercise Vital Sign   . Days of Exercise per Week: 0 days   . Minutes of Exercise per Session: 0 min  Stress: No Stress Concern Present (06/21/2023)   [employer] of Occupational Health - Occupational Stress Questionnaire   . Feeling of Stress : Not at all  Social Connections: Moderately Isolated (06/21/2023)   Social Connection and Isolation Panel [NHANES]   . Frequency of Communication with Friends and Family: More than three times a week   . Frequency of Social Gatherings with Friends and Family: More than three times a week   . Attends Religious Services: Never   . Active Member of Clubs or Organizations: No   . Attends Banker Meetings: Never   . Marital Status: Married  Housing: Low Risk  (06/21/2023)   Housing   . Within the past 12 months, have you ever stayed: outside, in a car, in a tent, in an overnight shelter, or temporarily in someone else's home (i.e. couch-surfing)?: No   . Are you worried about losing your housing?: No    Review of Systems Except as noted in the history of present of illness, all other systems are negative.   Physical Exam  Constitutional: Elderly male, in no acute distress Blood pressure 140/80, pulse 78, temperature 36.7 C (98.1 F), temperature source Temporal, resp. rate 18, weight  70.7 kg (155 lb 12.8 oz), SpO2 97%.  Cardiovascular: Irregularly irregular. No murmurs. No rubs or gallops. PMI not displaced. No thrills, lifts, or heaves. No carotid bruits. No edema.  Respiratory: Normal respiratory effort with symmetrical lung expansion. Clear to auscultation. Skin: Skin is warm, moist, soft, and elastic. No discoloration or lesions  Neurologic: Alert and oriented X 3.  Psychiatric: Mood and affect appropriate. Hematologic/Lymphatic/Immunologic: No signs of bleeding or excessive bruising.

## 2024-03-27 NOTE — Progress Notes (Signed)
 Rheumatology Clinic Initial Visit Note   Reason for consult: Jackson Lawson is seen in consultation at the request of Dr. Alm Alto Eagles for evaluation of RA  Assessment and Plan: Jackson Lawson is a 83 y.o.  male with a PMH of RA, Afib, CAD, HTN who presents today for evaluation RA.  Patient Dx with Ra in 2013 with previous trials of MTX (mood side effects), HCQ (stopped 2/2 unclear side effect), leflunomide (stopped due to loss of follow up).  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 Also found to have coarse lung findings on CXR 12/2023 that was obtained due to abnormal breath sounds. 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 Will obtain HRCT and PFTs as this influences treatment decisions. Should ILD be found, would favor Rituximab. - Anticipate Rituximab - HRCT - CBC w/ diff, Cr, hepatic panel - Update infectious screening - XR hands/feet  Return in about 3 months (around 06/28/2024).  Jackson Holt, MD Assistant Professor of Medicine Department of Medicine/Division of Rheumatology [facility]  HPI: Jackson Lawson is a 83 y.o.  male with a PMH of RA, afib, CAD, HTN who presents today with bilateral hand contractures and evaluation of his rheumatoid arthritis.   Patient has declined further therapy including biologic / DMARDs. On leflunomide 20 mg daily. He unintentionally lost 5 pounds with the past months, decreased appetite, no dysphagia, normal bowel movement, had a colonoscopy before does not recall when exactly. No new cough or dyspnea. He is a non-smoker and nondrinker. No muscle weakness, no new skin rashes. No headache, no shoulder or hip girdle stiffness. He has seen his primary care physician a month ago mentioned the weight loss. Treatment, leflunomide 20 mg daily since June 2018. Prior treatments: intolerance methotrexate, Plaquenil   First diagnosed about 10 years  ago. Has failed treatment with Methotrexate (made him mean/irritable), and apparently Plaquenil though does not recall. Was on Leflunomide, does not remember when he stopped this.   Hard to walk, has to use a cane.   Hard to get around, has a lot of pain in his fingers, very limited movement in his 2-5th fingers on both hands.   No fevers recently, no rashes, hand itches but no joint redness he has noticed, No real issues with chest pain or shortness of breath.   Hands bother him the most.   ROS: 14 systems were reviewed and are negative except for that mentioned in the HPI.  Allergies: Fenofibrate and Simvastatin Immunizations:  Immunization History  Administered Date(s) Administered  . COVID-19 VACCINE,MRNA(MODERNA)(PF) 01/07/2020, 02/04/2020  . INFLUENZA QUAD HIGH DOSE 10YRS+(FLUZONE) 07/20/2021, 08/09/2022  . INFLUENZA VACCINE IIV3(IM)(PF)6 MOS UP 09/21/2023  . Pneumococcal Conjugate 20-valent 09/21/2023    PMHx:  Past Medical History:  Diagnosis Date  . A-fib     . Arthritis   . Hypertension    PSHx: No past surgical history on file. Family Hx:  Family History  Problem Relation Age of Onset  . Heart disease Mother   . Drug abuse Neg Hx   . Mental illness Neg Hx    Social Hx:  Social History   Socioeconomic History  . Marital status: Married    Spouse name: Clarita  . Number of children: 3  Occupational History  . Occupation: Retired  Tobacco Use  . Smoking status: Never    Passive exposure: Never  . Smokeless tobacco: Never  Vaping Use  .  Vaping status: Never Used  Substance and Sexual Activity  . Alcohol use: Never  . Drug use: Never  . Sexual activity: Not Currently    Partners: Female   Social Drivers of Health   Financial Resource Strain: Low Risk  (06/21/2023)   Overall Financial Resource Strain (CARDIA)   . Difficulty of Paying Living Expenses: Not hard at all  Food Insecurity: No Food Insecurity (06/21/2023)   Hunger Vital Sign   . Worried About  Programme researcher, broadcasting/film/video in the Last Year: Never true   . Ran Out of Food in the Last Year: Never true  Transportation Needs: No Transportation Needs (06/21/2023)   PRAPARE - Transportation   . Lack of Transportation (Medical): No   . Lack of Transportation (Non-Medical): No  Physical Activity: Inactive (06/21/2023)   Exercise Vital Sign   . Days of Exercise per Week: 0 days   . Minutes of Exercise per Session: 0 min  Stress: No Stress Concern Present (06/21/2023)   [employer] of Occupational Health - Occupational Stress Questionnaire   . Feeling of Stress : Not at all  Social Connections: Moderately Isolated (06/21/2023)   Social Connection and Isolation Panel [NHANES]   . Frequency of Communication with Friends and Family: More than three times a week   . Frequency of Social Gatherings with Friends and Family: More than three times a week   . Attends Religious Services: Never   . Active Member of Clubs or Organizations: No   . Attends Banker Meetings: Never   . Marital Status: Married  Housing: Low Risk  (06/21/2023)   Housing   . Within the past 12 months, have you ever stayed: outside, in a car, in a tent, in an overnight shelter, or temporarily in someone else's home (i.e. couch-surfing)?: No   . Are you worried about losing your housing?: No    Medications:  Current Outpatient Medications  Medication Sig Dispense Refill  . amlodipine (NORVASC) 10 MG tablet TAKE 1 TABLET BY MOUTH DAILY 80 tablet 2  . apixaban  (ELIQUIS ) 5 mg Tab TAKE 1 TABLET BY MOUTH TWICE  DAILY 200 tablet 1  . ascorbic acid, vitamin C, (VITAMIN C) 100 MG tablet Take 1 tablet (100 mg total) by mouth.    . cholecalciferol, vitamin D3 25 mcg, 1,000 units,, 1,000 unit (25 mcg) tablet Take 1 tablet (25 mcg total) by mouth.    . FLUoxetine  (PROZAC ) 20 MG capsule TAKE 1 CAPSULE BY MOUTH DAILY 100 capsule 1  . losartan-hydroCHLOROthiazide (HYZAAR) 100-25 mg per tablet Take 1 tablet by mouth daily. 90  tablet 1  . multivitamin (TAB-A-VITE/THERAGRAN) per tablet Take 1 tablet by mouth daily.    . nitroglycerin  (NITROSTAT ) 0.4 MG SL tablet Place 1 tablet (0.4 mg total) under the tongue every five (5) minutes as needed. 25 tablet 2  . omeprazole (PRILOSEC) 40 MG capsule TAKE 1 CAPSULE BY MOUTH DAILY 100 capsule 2   No current facility-administered medications for this visit.    Physical Exam:  Vitals:   03/28/24 0930  BP: 127/75  BP Position: Sitting  Pulse: 77  Temp: 36.7 C (98.1 F)  TempSrc: Temporal  Weight: 71.7 kg (158 lb)   GEN:  In no acute distress.  Well nourished and well kempt. HEENT: No scarring alopecia. [place]/AT. EOMI, sclera white.. Chest: Good respiratory effort with symmetrical chest expansion.  Clear. Cardiovascular: RRR, normal s1/s2.  2+ distal pulses equal bilaterally. GI: Soft, NT, ND MSK:    Hands:  Non-tender. 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 Bony hypertrophy to all MCP joints, no active erythema or boggy joint swelling  Wrists: Non-tender. No swelling/synovitis. Full ROM.  Elbows: Non-tender. No swelling/synovitis. Full ROM.  Shoulders: Non-tender. Moderately limited left shoulder rotation  Neck: Non-tender to mid-line palpation. Full ROM.  Back: Non-tender to mid-line palpation. Lower back musculature nontender.   Hips: Full ROM.  Knees: Non-tender. No swelling/synovitis. Full ROM.  Ankles: Non-tender. No swelling/synovitis. Full ROM.  Feet: Non-tender. No swelling/synovitis. Toe digits 2-5 bilaterally with bony hypertrophy to MTP joints and limited ROM Neuro: AAOx3. No focal neurologic deficits.   Skin: Scattered bruising across arms. No rashes, nodules or dry skin noted. No cyanosis or clubbing. Psych: Normal mood and affect. Normal thought process.  I personally spent 68 minutes face-to-face and non-face-to-face in the care of this patient, which includes all pre, intra, and post visit  time on the date of service.  All documented time was specific to the E/M visit and does not include any procedures that may have been performed.

## 2024-03-31 NOTE — Progress Notes (Signed)
 Changes on x-ray concerning for advanced scarring of the lungs, suggestive of pulmonary fibrosis. An order is in place for high-resolution chest CT. Please obtain scan.

## 2024-03-31 NOTE — Progress Notes (Signed)
 Ultrasound demonstrates some abnormal findings of the liver suggesting possible chronic liver injury, chronic liver disease. Ultrasound of the kidneys demonstrates multiple cysts on the kidneys on both sides.  Given findings related to the liver I recommend evaluation with hepatology, liver specialist for further evaluation recommendations.  In the setting of bilateral renal cysts [kidney cysts] I recommend further evaluation with CT of the abdomen and pelvis with contrast.  Order has been placed for referral to hepatology. Order has been placed for CT of the abdomen pelvis with contrast.  Recent x-ray demonstrated changes concerning for interstitial lung disease. Orders been placed for CT high-resolution by rheumatology. Additionally with these findings recommend appointment with pulmonology.  Keep planned follow-up appointment. CT of the abdomen and pelvis with contrast to evaluate kidneys further. Referral to hepatology to evaluate liver further. CT high-resolution of the chest to evaluate lungs further. Referral to pulmonology in the setting of suspected lung condition findings. Okay to schedule sooner than current appointment for reevaluation and further discussion.

## 2024-04-04 NOTE — Progress Notes (Signed)
 Patients wife advised of lab results and verbalized understanding

## 2024-06-14 NOTE — Telephone Encounter (Signed)
 Noted.  Will discuss with the patient on follow-up.  Appointment scheduled for June 20, 2024.  Keep planned follow-up appointments.  [name], MD [facility] [address], [address] [place], [place] [address] Phone [phone]

## 2024-06-14 NOTE — Telephone Encounter (Signed)
-----   Message from Roxie Pay, RRT sent at 06/12/2024  1:22 PM EDT ----- Regarding: Pulmonary Referral I called this patient today to switch his pulmonary appointment to Dr. Clayborne. He told me he was unaware of the referral and did not need it. He said he would speak to you next week concerning the referral at his appointment. I explained to him why the referral was ordered, but he requested we cancel it.  Thanks! Kristie

## 2024-06-24 ENCOUNTER — Inpatient Hospital Stay (HOSPITAL_COMMUNITY)

## 2024-06-24 ENCOUNTER — Encounter (HOSPITAL_COMMUNITY): Payer: Self-pay | Admitting: Internal Medicine

## 2024-06-24 ENCOUNTER — Other Ambulatory Visit: Payer: Self-pay

## 2024-06-24 ENCOUNTER — Emergency Department (HOSPITAL_COMMUNITY)

## 2024-06-24 ENCOUNTER — Inpatient Hospital Stay (HOSPITAL_COMMUNITY)
Admission: EM | Admit: 2024-06-24 | Discharge: 2024-06-27 | DRG: 065 | Disposition: A | Discharge status: Home Health | Attending: Internal Medicine | Admitting: Internal Medicine

## 2024-06-24 DIAGNOSIS — I4891 Unspecified atrial fibrillation: Secondary | ICD-10-CM | POA: Diagnosis not present

## 2024-06-24 DIAGNOSIS — I6350 Cerebral infarction due to unspecified occlusion or stenosis of unspecified cerebral artery: Secondary | ICD-10-CM | POA: Diagnosis not present

## 2024-06-24 DIAGNOSIS — R29708 NIHSS score 8: Secondary | ICD-10-CM | POA: Diagnosis present

## 2024-06-24 DIAGNOSIS — Z7982 Long term (current) use of aspirin: Secondary | ICD-10-CM

## 2024-06-24 DIAGNOSIS — I6329 Cerebral infarction due to unspecified occlusion or stenosis of other precerebral arteries: Secondary | ICD-10-CM | POA: Diagnosis not present

## 2024-06-24 DIAGNOSIS — R29818 Other symptoms and signs involving the nervous system: Secondary | ICD-10-CM | POA: Diagnosis not present

## 2024-06-24 DIAGNOSIS — Z66 Do not resuscitate: Secondary | ICD-10-CM | POA: Diagnosis present

## 2024-06-24 DIAGNOSIS — R1310 Dysphagia, unspecified: Secondary | ICD-10-CM | POA: Diagnosis present

## 2024-06-24 DIAGNOSIS — M069 Rheumatoid arthritis, unspecified: Secondary | ICD-10-CM | POA: Diagnosis present

## 2024-06-24 DIAGNOSIS — Z7901 Long term (current) use of anticoagulants: Secondary | ICD-10-CM | POA: Diagnosis not present

## 2024-06-24 DIAGNOSIS — I4811 Longstanding persistent atrial fibrillation: Secondary | ICD-10-CM | POA: Diagnosis present

## 2024-06-24 DIAGNOSIS — I6320 Cerebral infarction due to unspecified occlusion or stenosis of unspecified precerebral arteries: Secondary | ICD-10-CM | POA: Diagnosis not present

## 2024-06-24 DIAGNOSIS — F32A Depression, unspecified: Secondary | ICD-10-CM | POA: Diagnosis present

## 2024-06-24 DIAGNOSIS — I69391 Dysphagia following cerebral infarction: Secondary | ICD-10-CM

## 2024-06-24 DIAGNOSIS — R4781 Slurred speech: Secondary | ICD-10-CM

## 2024-06-24 DIAGNOSIS — I739 Peripheral vascular disease, unspecified: Secondary | ICD-10-CM | POA: Diagnosis not present

## 2024-06-24 DIAGNOSIS — I10 Essential (primary) hypertension: Secondary | ICD-10-CM | POA: Diagnosis present

## 2024-06-24 DIAGNOSIS — I6389 Other cerebral infarction: Secondary | ICD-10-CM | POA: Diagnosis not present

## 2024-06-24 DIAGNOSIS — E785 Hyperlipidemia, unspecified: Secondary | ICD-10-CM

## 2024-06-24 DIAGNOSIS — R471 Dysarthria and anarthria: Secondary | ICD-10-CM | POA: Diagnosis present

## 2024-06-24 DIAGNOSIS — K219 Gastro-esophageal reflux disease without esophagitis: Secondary | ICD-10-CM | POA: Diagnosis present

## 2024-06-24 DIAGNOSIS — Z8679 Personal history of other diseases of the circulatory system: Secondary | ICD-10-CM

## 2024-06-24 DIAGNOSIS — R531 Weakness: Secondary | ICD-10-CM | POA: Diagnosis present

## 2024-06-24 DIAGNOSIS — Z79899 Other long term (current) drug therapy: Secondary | ICD-10-CM

## 2024-06-24 DIAGNOSIS — I2510 Atherosclerotic heart disease of native coronary artery without angina pectoris: Secondary | ICD-10-CM | POA: Diagnosis present

## 2024-06-24 DIAGNOSIS — R29810 Facial weakness: Secondary | ICD-10-CM | POA: Diagnosis present

## 2024-06-24 DIAGNOSIS — G8194 Hemiplegia, unspecified affecting left nondominant side: Secondary | ICD-10-CM | POA: Diagnosis present

## 2024-06-24 DIAGNOSIS — Z7952 Long term (current) use of systemic steroids: Secondary | ICD-10-CM | POA: Diagnosis not present

## 2024-06-24 DIAGNOSIS — Z888 Allergy status to other drugs, medicaments and biological substances status: Secondary | ICD-10-CM | POA: Diagnosis not present

## 2024-06-24 DIAGNOSIS — Z8739 Personal history of other diseases of the musculoskeletal system and connective tissue: Secondary | ICD-10-CM | POA: Diagnosis not present

## 2024-06-24 LAB — DIFFERENTIAL
Abs Immature Granulocytes: 0.04 K/uL (ref 0.00–0.07)
Basophils Absolute: 0.1 K/uL (ref 0.0–0.1)
Basophils Relative: 1 %
Eosinophils Absolute: 1 K/uL — ABNORMAL HIGH (ref 0.0–0.5)
Eosinophils Relative: 10 %
Immature Granulocytes: 0 %
Lymphocytes Relative: 21 %
Lymphs Abs: 2.1 K/uL (ref 0.7–4.0)
Monocytes Absolute: 0.9 K/uL (ref 0.1–1.0)
Monocytes Relative: 9 %
Neutro Abs: 5.9 K/uL (ref 1.7–7.7)
Neutrophils Relative %: 59 %

## 2024-06-24 LAB — I-STAT CHEM 8, ED
BUN: 18 mg/dL (ref 8–23)
Calcium, Ion: 1.09 mmol/L — ABNORMAL LOW (ref 1.15–1.40)
Chloride: 104 mmol/L (ref 98–111)
Creatinine, Ser: 1 mg/dL (ref 0.61–1.24)
Glucose, Bld: 135 mg/dL — ABNORMAL HIGH (ref 70–99)
HCT: 46 % (ref 39.0–52.0)
Hemoglobin: 15.6 g/dL (ref 13.0–17.0)
Potassium: 3.2 mmol/L — ABNORMAL LOW (ref 3.5–5.1)
Sodium: 140 mmol/L (ref 135–145)
TCO2: 22 mmol/L (ref 22–32)

## 2024-06-24 LAB — CBC
HCT: 43.6 % (ref 39.0–52.0)
HCT: 42.8 % (ref 39.0–52.0)
Hemoglobin: 14.2 g/dL (ref 13.0–17.0)
Hemoglobin: 13.9 g/dL (ref 13.0–17.0)
MCH: 30 pg (ref 26.0–34.0)
MCH: 30 pg (ref 26.0–34.0)
MCHC: 32.6 g/dL (ref 30.0–36.0)
MCHC: 32.5 g/dL (ref 30.0–36.0)
MCV: 92.2 fL (ref 80.0–100.0)
MCV: 92.4 fL (ref 80.0–100.0)
Platelets: 96 K/uL — ABNORMAL LOW (ref 150–400)
Platelets: 89 K/uL — ABNORMAL LOW (ref 150–400)
RBC: 4.73 MIL/uL (ref 4.22–5.81)
RBC: 4.63 MIL/uL (ref 4.22–5.81)
RDW: 13.5 % (ref 11.5–15.5)
RDW: 13.6 % (ref 11.5–15.5)
WBC: 10 K/uL (ref 4.0–10.5)
WBC: 9.9 K/uL (ref 4.0–10.5)
nRBC: 0 % (ref 0.0–0.2)
nRBC: 0 % (ref 0.0–0.2)

## 2024-06-24 LAB — RAPID URINE DRUG SCREEN, HOSP PERFORMED
Amphetamines: NOT DETECTED
Barbiturates: NOT DETECTED
Benzodiazepines: NOT DETECTED
Cocaine: NOT DETECTED
Opiates: NOT DETECTED
Tetrahydrocannabinol: NOT DETECTED

## 2024-06-24 LAB — COMPREHENSIVE METABOLIC PANEL WITH GFR
ALT: 10 U/L (ref 0–44)
ALT: 11 U/L (ref 0–44)
AST: 17 U/L (ref 15–41)
AST: 18 U/L (ref 15–41)
Albumin: 3.4 g/dL — ABNORMAL LOW (ref 3.5–5.0)
Albumin: 3.4 g/dL — ABNORMAL LOW (ref 3.5–5.0)
Alkaline Phosphatase: 59 U/L (ref 38–126)
Alkaline Phosphatase: 60 U/L (ref 38–126)
Anion gap: 13 (ref 5–15)
Anion gap: 12 (ref 5–15)
BUN: 15 mg/dL (ref 8–23)
BUN: 14 mg/dL (ref 8–23)
CO2: 23 mmol/L (ref 22–32)
CO2: 25 mmol/L (ref 22–32)
Calcium: 9.1 mg/dL (ref 8.9–10.3)
Calcium: 9.1 mg/dL (ref 8.9–10.3)
Chloride: 102 mmol/L (ref 98–111)
Chloride: 102 mmol/L (ref 98–111)
Creatinine, Ser: 1.1 mg/dL (ref 0.61–1.24)
Creatinine, Ser: 1.07 mg/dL (ref 0.61–1.24)
GFR, Estimated: >60 mL/min (ref >60)
GFR, Estimated: >60 mL/min (ref >60)
Glucose, Bld: 136 mg/dL — ABNORMAL HIGH (ref 70–99)
Glucose, Bld: 149 mg/dL — ABNORMAL HIGH (ref 70–99)
Potassium: 3.3 mmol/L — ABNORMAL LOW (ref 3.5–5.1)
Potassium: 3.8 mmol/L (ref 3.5–5.1)
Sodium: 138 mmol/L (ref 135–145)
Sodium: 139 mmol/L (ref 135–145)
Total Bilirubin: 0.4 mg/dL (ref 0.0–1.2)
Total Bilirubin: 0.6 mg/dL (ref 0.0–1.2)
Total Protein: 8 g/dL (ref 6.5–8.1)
Total Protein: 8.1 g/dL (ref 6.5–8.1)

## 2024-06-24 LAB — LIPID PANEL
Cholesterol: 132 mg/dL (ref 0–200)
HDL: 42 mg/dL (ref >40)
LDL Cholesterol: 80 mg/dL (ref 0–99)
Total CHOL/HDL Ratio: 3.1 ratio
Triglycerides: 48 mg/dL (ref <150)
VLDL: 10 mg/dL (ref 0–40)

## 2024-06-24 LAB — HEMOGLOBIN A1C
Hgb A1c MFr Bld: 6 % — ABNORMAL HIGH (ref 4.8–5.6)
Mean Plasma Glucose: 125.5 mg/dL

## 2024-06-24 LAB — PROTIME-INR
INR: 1.3 — ABNORMAL HIGH (ref 0.8–1.2)
Prothrombin Time: 17.1 s — ABNORMAL HIGH (ref 11.4–15.2)

## 2024-06-24 LAB — CBG MONITORING, ED: Glucose-Capillary: 140 mg/dL — ABNORMAL HIGH (ref 70–99)

## 2024-06-24 LAB — APTT: aPTT: 31 s (ref 24–36)

## 2024-06-24 LAB — ETHANOL: Alcohol, Ethyl (B): <15 mg/dL (ref <15)

## 2024-06-24 MED ORDER — DEXTROSE IN LACTATED RINGERS 5 % IV SOLN: INTRAVENOUS | Status: DC

## 2024-06-24 MED ORDER — ACETAMINOPHEN 325 MG PO TABS: 650.0000 mg | ORAL_TABLET | ORAL | Status: DC | PRN

## 2024-06-24 MED ORDER — POTASSIUM CHLORIDE 10 MEQ/100ML IV SOLN
10.0000 meq | INTRAVENOUS | Status: AC
  Administered 2024-06-24 (×2): 10 meq via INTRAVENOUS
  Filled 2024-06-24 (×2): qty 100

## 2024-06-24 MED ORDER — ACETAMINOPHEN 160 MG/5ML PO SOLN: 650.0000 mg | ORAL | Status: DC | PRN

## 2024-06-24 MED ORDER — STROKE: EARLY STAGES OF RECOVERY BOOK
Freq: Once | Status: AC
  Filled 2024-06-24: qty 1

## 2024-06-24 MED ORDER — PANTOPRAZOLE SODIUM 40 MG PO TBEC
40.0000 mg | DELAYED_RELEASE_TABLET | Freq: Every day | ORAL | Status: DC
  Administered 2024-06-24: 40 mg via ORAL
  Filled 2024-06-24: qty 1

## 2024-06-24 MED ORDER — ACETAMINOPHEN 325 MG PO TABS
650.0000 mg | ORAL_TABLET | Freq: Four times a day (QID) | ORAL | Status: DC | PRN
  Administered 2024-06-24: 650 mg via ORAL
  Filled 2024-06-24: qty 2

## 2024-06-24 MED ORDER — SODIUM CHLORIDE 0.9 % IV SOLN: INTRAVENOUS | Status: DC

## 2024-06-24 MED ORDER — ONDANSETRON HCL 4 MG/2ML IJ SOLN
4.0000 mg | Freq: Four times a day (QID) | INTRAMUSCULAR | Status: DC | PRN
  Administered 2024-06-24: 4 mg via INTRAVENOUS
  Filled 2024-06-24: qty 2

## 2024-06-24 MED ORDER — GADOBUTROL 1 MMOL/ML IV SOLN
7.0000 mL | Freq: Once | INTRAVENOUS | Status: AC | PRN
  Administered 2024-06-24: 7 mL via INTRAVENOUS

## 2024-06-24 MED ORDER — ASPIRIN 81 MG PO TBEC
81.0000 mg | DELAYED_RELEASE_TABLET | Freq: Every day | ORAL | Status: DC
  Administered 2024-06-25 – 2024-06-27 (×3): 81 mg via ORAL
  Filled 2024-06-24 (×3): qty 1

## 2024-06-24 MED ORDER — ACETAMINOPHEN 650 MG RE SUPP: 650.0000 mg | Freq: Four times a day (QID) | RECTAL | Status: DC | PRN

## 2024-06-24 MED ORDER — LABETALOL HCL 5 MG/ML IV SOLN: 10.0000 mg | Freq: Three times a day (TID) | INTRAVENOUS | Status: DC | PRN

## 2024-06-24 MED ORDER — DIAZEPAM 5 MG/ML IJ SOLN
2.5000 mg | Freq: Once | INTRAMUSCULAR | Status: AC
  Administered 2024-06-24: 2.5 mg via INTRAVENOUS
  Filled 2024-06-24: qty 2

## 2024-06-24 MED ORDER — ONDANSETRON 4 MG PO TBDP: 4.0000 mg | ORAL_TABLET | Freq: Three times a day (TID) | ORAL | Status: DC | PRN

## 2024-06-24 MED ORDER — ACETAMINOPHEN 650 MG RE SUPP: 650.0000 mg | RECTAL | Status: DC | PRN

## 2024-06-24 MED ORDER — APIXABAN 5 MG PO TABS
5.0000 mg | ORAL_TABLET | Freq: Two times a day (BID) | ORAL | Status: DC
  Administered 2024-06-24 – 2024-06-27 (×6): 5 mg via ORAL
  Filled 2024-06-24 (×6): qty 1

## 2024-06-24 MED ORDER — FLUOXETINE HCL 20 MG PO CAPS: 20.0000 mg | ORAL_CAPSULE | Freq: Every day | ORAL | Status: DC

## 2024-06-24 MED ORDER — IOHEXOL 350 MG/ML SOLN
75.0000 mL | Freq: Once | INTRAVENOUS | Status: AC | PRN
  Administered 2024-06-24: 75 mL via INTRAVENOUS

## 2024-06-24 NOTE — Plan of Care (Signed)

## 2024-06-24 NOTE — ED Notes (Signed)
 CCMD contacted for pt monitoring. Monitoring successful

## 2024-06-24 NOTE — ED Notes (Signed)
 Pt transported to have swallow screen completed

## 2024-06-24 NOTE — Evaluation (Signed)
 Occupational Therapy Evaluation Patient Details Name: Jackson Lawson MRN: [id number] DOB: 01-06-1941 Today's Date: 06/24/2024   History of Present Illness   Jackson Lawson is a 83 y.o. male who presented to [facility] ED 06/24/24 as a code stroke. 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 PMH: RA, CAD, HLD, HTN, afib, mitral regurgitation, aortic insufficiency     Clinical Impressions PTA, pt lived with wife and was independent in BADL; seldom drives; family assists with medication management. Pt typically uses cane, but admits he forgets it sometimes. Upon eval, pt with L weakness, decreased processing speed, cognition, balance, and initiation. Pt with fear of falling but agreeable to formal assessment after reassurance of needed assist would be present. Pt currently requiring mod A - total A for BADL and +2 mod-max A for mobility. Will continue to follow. Due to good family support, significant change in functional status, and pt motivation, recommending intensive multidisciplinary rehabilitation >3 hours/day to optimize safety and independence in ADL.        If plan is discharge home, recommend the following:   Two people to help with walking and/or transfers;Two people to help with bathing/dressing/bathroom;Assistance with cooking/housework;Direct supervision/assist for medications management;Direct supervision/assist for financial management;Assist for transportation;Help with stairs or ramp for entrance     Functional Status Assessment   Patient has had a recent decline in their functional status and demonstrates the ability to make significant improvements in function in a reasonable and predictable amount of time.     Equipment Recommendations   Other (comment) (defer)     Recommendations for Other Services   Rehab consult;Speech consult     Precautions/Restrictions   Precautions Precautions:  Fall Precaution/Restrictions Comments: SBP goal: permissive hypertension first 24 h < 220/110.     Mobility Bed Mobility Overal bed mobility: Needs Assistance Bed Mobility: Supine to Sit, Sit to Supine     Supine to sit: Mod assist Sit to supine: Mod assist, +2 for safety/equipment        Transfers Overall transfer level: Needs assistance Equipment used: 2 person hand held assist Transfers: Sit to/from Stand Sit to Stand: Mod assist, +2 physical assistance, +2 safety/equipment, Max assist           General transfer comment: assist to power up; once hips cleared from EOB, pt attempts to lift LLE up off floor but denies pain but poor tolerance of weight bearing and RLE buckling needing knee block. second attempt with block to bil sides of L knee, greater tolerance of weightbearing, but continues to require modA      Balance Overall balance assessment: Needs assistance Sitting-balance support: No upper extremity supported, Feet supported, Bilateral upper extremity supported Sitting balance-Leahy Scale: Poor Sitting balance - Comments: min-mod A at EOB; brief bout of CGA   Standing balance support: Bilateral upper extremity supported, During functional activity Standing balance-Leahy Scale: Poor Standing balance comment: poor to 0; needs 2 person assist for static standing                           ADL either performed or assessed with clinical judgement   ADL Overall ADL's : Needs assistance/impaired Eating/Feeding: Set up;Bed level   Grooming: Set up;Bed level   Upper Body Bathing: Moderate assistance;Sitting   Lower Body Bathing: Sitting/lateral leans;Sit to/from stand;+2 for safety/equipment;Maximal assistance   Upper Body Dressing : Moderate assistance;Sitting   Lower Body Dressing: Total  assistance Lower Body Dressing Details (indicate cue type and reason): for socks Toilet Transfer: Moderate assistance;+2 for physical assistance;+2 for  safety/equipment Toilet Transfer Details (indicate cue type and reason): sts only         Functional mobility during ADLs: Moderate assistance;+2 for physical assistance;+2 for safety/equipment General ADL Comments: STS only     Vision Patient Visual Report: No change from baseline Vision Assessment?: Yes Visual Fields: No apparent deficits     Perception Perception: Not tested       Praxis Praxis: Not tested       Pertinent Vitals/Pain Pain Assessment Pain Assessment: Faces Faces Pain Scale: Hurts a little bit Pain Location: generalized Pain Descriptors / Indicators: Discomfort Pain Intervention(s): Monitored during session     Extremity/Trunk Assessment Upper Extremity Assessment Upper Extremity Assessment: Right hand dominant;LUE deficits/detail (bil dupytren contractures at baseline with family reporting he predominantly has use of 1st and 2nd digits bilaterally) LUE Deficits / Details: generally weak, 2+/5 strength   Lower Extremity Assessment Lower Extremity Assessment: Defer to PT evaluation (denied changes in sensation, able to detect light touch bilaterally)   Cervical / Trunk Assessment Cervical / Trunk Assessment:  (forward rounding of shoulders)   Communication Communication Communication: Impaired Factors Affecting Communication: Reduced clarity of speech;Difficulty expressing self (difficulty word finding as well, mild slurred speech)   Cognition Arousal: Alert Behavior During Therapy: Flat affect Cognition: Cognition impaired     Awareness: Intellectual awareness intact, Online awareness impaired Memory impairment (select all impairments): Short-term memory Attention impairment (select first level of impairment): Selective attention, Sustained attention Executive functioning impairment (select all impairments): Organization, Sequencing, Problem solving, Reasoning OT - Cognition Comments: pt fearful of moving in light of new deficits. pt needing  increased time to answer questions during history taking with wife/daughter providing some corrections to pt's report of equipment he has and uses at home. pt follows one step commands. Intermittent cues during mobility for safety                 Following commands: Impaired Following commands impaired: Follows one step commands with increased time, Follows multi-step commands inconsistently     Cueing  General Comments   Cueing Techniques: Verbal cues;Gestural cues;Tactile cues  VSS. family reports that wife can be with pt at all times, and other members can fill in if pt were to need more hands on assist around the clock   Exercises     Shoulder Instructions      Home Living Family/patient expects to be discharged to:: Private residence Living Arrangements: Spouse/significant other Available Help at Discharge: Family Type of Home: House Home Access: Stairs to enter [employer] of Steps: 1 (threshold) Entrance Stairs-Rails: None Home Layout: One level Alternate Level Stairs-Number of Steps: 2 (down to the bedroom and into the kitchen) Alternate Level Stairs-Rails: Left Bathroom Shower/Tub: Chief Strategy Officer: Standard     Home Equipment: Cane - single point;Shower seat          Prior Functioning/Environment Prior Level of Function : Independent/Modified Independent;History of Falls (last six months)             Mobility Comments: He sleeps in a recliner chair a lot. Ambulates using SPC. Pt reports he forgets it sometimes. Several falls in the past 6 mo, estimating 1 a month, but wife reports he hasn't fallen recently. ADLs Comments: Indep with ADLs. Wife manages IADLs including houshold chores, cooking, and managing medications. Pt reports he drives very little. Wife  states they just don't go anywhere.    OT Problem List: Decreased strength;Decreased activity tolerance;Impaired balance (sitting and/or standing);Decreased  coordination;Decreased cognition;Decreased safety awareness;Decreased knowledge of use of DME or AE;Impaired UE functional use   OT Treatment/Interventions: Self-care/ADL training;Therapeutic exercise;DME and/or AE instruction;Therapeutic activities;Patient/family education;Balance training;Cognitive remediation/compensation      OT Goals(Current goals can be found in the care plan section)   Acute Rehab OT Goals Patient Stated Goal: get better OT Goal Formulation: With patient Time For Goal Achievement: 07/08/24 Potential to Achieve Goals: Good   OT Frequency:  Min 2X/week    Co-evaluation PT/OT/SLP Co-Evaluation/Treatment: Yes Reason for Co-Treatment: Complexity of the patient's impairments (multi-system involvement);For patient/therapist safety;To address functional/ADL transfers PT goals addressed during session: Mobility/safety with mobility OT goals addressed during session: Strengthening/ROM;ADL's and self-care      AM-PAC OT 6 Clicks Daily Activity     Outcome Measure Help from another person eating meals?: A Little Help from another person taking care of personal grooming?: A Little Help from another person toileting, which includes using toliet, bedpan, or urinal?: Total Help from another person bathing (including washing, rinsing, drying)?: A Lot Help from another person to put on and taking off regular upper body clothing?: A Lot Help from another person to put on and taking off regular lower body clothing?: Total 6 Click Score: 12   End of Session Equipment Utilized During Treatment: Gait belt Nurse Communication: Mobility status  Activity Tolerance: Patient tolerated treatment well Patient left: in bed;with call bell/phone within reach;with family/visitor present  OT Visit Diagnosis: Unsteadiness on feet (R26.81);Muscle weakness (generalized) (M62.81);History of falling (Z91.81);Other symptoms and signs involving cognitive function                Time:  9184-9157 OT Time Calculation (min): 27 min Charges:  OT General Charges $OT Visit: 1 Visit OT Evaluation $OT Eval Moderate Complexity: 1 Mod  Elma JONETTA Lebron FREDERICK, OTR/L [facility] Acute Rehabilitation Office: [phone]   Elma JONETTA Lebron 06/24/2024, 10:06 AM

## 2024-06-24 NOTE — ED Triage Notes (Incomplete)
 Per pts wife. Around 215 am pt called her into the room to help him get up from the bed. Wife states 30 mins later he started to slur his speech and was unable to move left arm and hand. Denies hx of stroke.

## 2024-06-24 NOTE — ED Notes (Signed)
 RN and NT completed full linen change, new brief, gown, and applied external condom catheter

## 2024-06-24 NOTE — Code Documentation (Addendum)
 Responded to Code Stroke called at 0322 for L sided facial droop, L sided weakness, and slurred speech, [id number]. Pt arrived at 0336, CBG-140, NIH-8, CT head negative for acute changes, CTA-no LVO. TNK not given-pt on eliquis . Plan: MRI. Please complete VS/neuro checks(including NIH) q2h x 12h, then q4h.

## 2024-06-24 NOTE — Consult Note (Signed)
 NEUROLOGY CONSULT NOTE   Date of service: June 24, 2024 Patient Name: Jackson Lawson MRN:  [id number] DOB:  1941-07-02 Chief Complaint: L sided weakness Requesting Provider: No att. providers found  History of Present Illness  Jackson Lawson is a 83 y.o. male with hx of CAD, hypertension, atrial fibrillation on Eliquis , rheumatoid arthritis who presents with left-sided weakness.  Patient went to bed at midnight and woke up with the middle of night with left-sided weakness.  EMS called and patient was brought into the ED as a code stroke.  No prior history of strokes.  Patient is on Eliquis  and took his dose last night.  LKW: Midnight Modified rankin score: 2-Slight disability-UNABLE to perform all activities but does not need assistance IV Thrombolysis: Not offered, patient is on Eliquis .   EVT: Not offered, no LVO.    NIHSS components Score: Comment  1a Level of Conscious 0[x]  1[]  2[]  3[]      1b LOC Questions 0[x]  1[]  2[]       1c LOC Commands 0[x]  1[]  2[]       2 Best Gaze 0[x]  1[]  2[]       3 Visual 0[x]  1[]  2[]  3[]      4 Facial Palsy 0[]  1[]  2[x]  3[]      5a Motor Arm - left 0[]  1[]  2[x]  3[]  4[]  UN[]    5b Motor Arm - Right 0[x]  1[]  2[]  3[]  4[]  UN[]    6a Motor Leg - Left 0[]  1[]  2[x]  3[]  4[]  UN[]    6b Motor Leg - Right 0[x]  1[]  2[]  3[]  4[]  UN[]    7 Limb Ataxia 0[x]  1[]  2[]  UN[]      8 Sensory 0[x]  1[]  2[]  UN[]      9 Best Language 0[x]  1[]  2[]  3[]      10 Dysarthria 0[]  1[]  2[x]  UN[]      11 Extinct. and Inattention 0[x]  1[]  2[]       TOTAL: 8      ROS  Comprehensive ROS performed and pertinent positives documented in HPI   Past History  No past medical history on file.  No past surgical history on file.  Family History: No family history on file.  Social History  reports that he has never smoked. He has never used smokeless tobacco. No history on file for alcohol use and drug use.  Allergies  Allergen Reactions   Fenofibrate Diarrhea   Simvastatin Diarrhea     Medications  No current facility-administered medications for this encounter.  Current Outpatient Medications:    amLODipine (NORVASC) 10 MG tablet, Take 10 mg by mouth daily., Disp: , Rfl:    apixaban  (ELIQUIS ) 5 MG TABS tablet, Take 5 mg by mouth 2 (two) times daily., Disp: , Rfl:    Ascorbic Acid (VITAMIN C) 100 MG tablet, Take by mouth. (Patient not taking: Reported on 09/10/2022), Disp: , Rfl:    aspirin  EC 81 MG tablet, Take by mouth. (Patient not taking: Reported on 09/10/2022), Disp: , Rfl:    betamethasone, augmented, (DIPROLENE) 0.05 % gel, betamethasone, augmented 0.05 % topical gel (Patient not taking: Reported on 09/10/2022), Disp: , Rfl:    cefUROXime (CEFTIN) 500 MG tablet, Take 500 mg by mouth 2 (two) times daily. (Patient not taking: Reported on 09/10/2022), Disp: , Rfl:    Cholecalciferol (VITAMIN D -1000 MAX ST) 25 MCG (1000 UT) tablet, Take by mouth. (Patient not taking: Reported on 09/10/2022), Disp: , Rfl:    FLUoxetine  (PROZAC ) 20 MG capsule, Take 20 mg by mouth daily., Disp: , Rfl:    leflunomide (  ARAVA) 20 MG tablet, Take 20 mg by mouth daily. (Patient not taking: Reported on 09/10/2022), Disp: , Rfl:    levofloxacin (LEVAQUIN) 500 MG tablet, Take 500 mg by mouth daily. (Patient not taking: Reported on 09/10/2022), Disp: , Rfl:    losartan-hydrochlorothiazide (HYZAAR) 100-25 MG tablet, Take by mouth. (Patient not taking: Reported on 09/10/2022), Disp: , Rfl:    metoprolol tartrate (LOPRESSOR) 50 MG tablet, metoprolol tartrate 50 mg tablet, Disp: , Rfl:    naproxen sodium (ALEVE) 220 MG tablet, Take by mouth. (Patient not taking: Reported on 09/10/2022), Disp: , Rfl:    omeprazole (PRILOSEC) 40 MG capsule, Take 40 mg by mouth daily. (Patient not taking: Reported on 09/10/2022), Disp: , Rfl:    predniSONE  (DELTASONE ) 5 MG tablet, Take 1 tablet (5 mg total) by mouth daily with breakfast. If tolerated for 7 days may increase dose to 10 mg po daily with breakfast, Disp: 60 tablet,  Rfl: 0  Vitals   Vitals:   06/24/24 0300  Weight: 73.9 kg  Height: 6' 2 (1.88 m)    Body mass index is 20.93 kg/m.   Physical Exam   General: Laying comfortably in bed; in no acute distress.  HENT: Normal oropharynx and mucosa. Normal external appearance of ears and nose.  Neck: Supple, no pain or tenderness  CV: No JVD. No peripheral edema.  Pulmonary: Symmetric Chest rise. Normal respiratory effort.  Abdomen: Soft to touch, non-tender.  Ext: No cyanosis, edema, or deformity  Skin: No rash. Normal palpation of skin.   Musculoskeletal: Normal digits and nails by inspection. No clubbing.   Neurologic Examination  Mental status/Cognition: Alert, oriented to self, place, month and year, good attention.  Speech/language: Fluent, comprehension intact, object naming intact, repetition intact.  Cranial nerves:   CN II Pupils equal and reactive to light, no VF deficits    CN III,IV,VI EOM intact, no gaze preference or deviation, no nystagmus    CN V normal sensation in V1, V2, and V3 segments bilaterally    CN VII Left facial droop   CN VIII normal hearing to speech    CN IX & X normal palatal elevation, no uvular deviation    CN XI 5/5 head turn and 5/5 shoulder shrug bilaterally    CN XII midline tongue protrusion    Motor:  Muscle bulk: Poor, tone normal, pronator drift noted in left upper extremity.  Mvmt Root Nerve  Muscle Right Left Comments  SA C5/6 Ax Deltoid 5 4   EF C5/6 Mc Biceps 5 4   EE C6/7/8 Rad Triceps 5 4   WF C6/7 Med FCR     WE C7/8 PIN ECU     F Ab C8/T1 U ADM/FDI 4 3   HF L1/2/3 Fem Illopsoas 5 3   KE L2/3/4 Fem Quad 5 4   DF L4/5 D Peron Tib Ant 5 3   PF S1/2 Tibial Grc/Sol 5 3    Sensation:  Light touch Intact throughout   Pin prick    Temperature    Vibration   Proprioception    Coordination/Complex Motor:  - Finger to Nose intact bilaterally - Heel to shin unable to do due to left leg weakness.  Intact in right lower extremity. - Rapid  alternating movement are slowed on the left. - Gait: Deferred for patient safety.  Labs/Imaging/Neurodiagnostic studies   CBC: No results for input(s): WBC, NEUTROABS, HGB, HCT, MCV, PLT in the last 168 hours. Basic Metabolic Panel:  Lab Results  Component Value Date   NA 137 09/10/2022   K 4.0 09/10/2022   CO2 27 09/10/2022   GLUCOSE 106 (H) 09/10/2022   BUN 14 09/10/2022   CREATININE 0.88 09/10/2022   CALCIUM  9.3 09/10/2022   GFRNONAA >60 09/10/2022   GFRAA  03/26/2009    >60        The eGFR has been calculated using the MDRD equation. This calculation has not been validated in all clinical situations. eGFR's persistently <60 mL/min signify possible Chronic Kidney Disease.   Lipid Panel:  Lab Results  Component Value Date   LDLCALC 125 (H) 12/03/2020   HgbA1c: No results found for: HGBA1C Urine Drug Screen: No results found for: LABOPIA, COCAINSCRNUR, LABBENZ, AMPHETMU, THCU, LABBARB  Alcohol Level No results found for: ETH INR No results found for: INR APTT No results found for: APTT AED levels: No results found for: PHENYTOIN, ZONISAMIDE, LAMOTRIGINE, LEVETIRACETA  CT Head without contrast(Personally reviewed): CTH was negative for a large hypodensity concerning for a large territory infarct or hyperdensity concerning for an ICH  CT angio Head and Neck with contrast(Personally reviewed): No LVO.  However, concern for nonocclusive thrombus versus artifact versus atherosclerotic web in bilateral cervical ICAs but more prominent on the right.  MRI Brain(Personally reviewed): Pending  ASSESSMENT   JW COVIN is a 83 y.o. male with hx of CAD, hypertension, atrial fibrillation on Eliquis , rheumatoid arthritis who presents with left-sided weakness.  Patient went to bed at midnight and woke up with the middle of night with left-sided weakness.  Presentation is concerning for stroke.  He is on Eliquis  and reports compliance.   CT head is negative for ICH or large territory infarct.  CTA with questionable nonocclusive thrombus versus artifact versus atherosclerotic web in bilateral cervical ICAs but more prominent on the right.  RECOMMENDATIONS  - Frequent Neuro checks per stroke unit protocol - Recommend brain imaging with MRI Brain without contrast - Recommend Vascular imaging with MRA Angio Head without contrast and MR angio neck with and without contrast.  This is to clarify concern for nonocclusive thrombus versus carotid web on CTA. -No need for TTE from a stroke standpoint as patient is already on Eliquis . - Recommend obtaining Lipid panel with LDL - Please start statin if LDL > 70 - Recommend HbA1c to evaluate for diabetes and how well it is controlled. - Hold off on Eliquis .  Timing of resuming Eliquis  based on size of the stroke on the MRI. - SBP goal - permissive hypertension first 24 h < 220/110. Held home meds.  - Recommend Telemetry monitoring for arrythmia - Recommend bedside swallow screen prior to PO intake. - Stroke education booklet - Recommend PT/OT/SLP consult ______________________________________________________________________  I personally spent a total of 75 minutes in the care of the patient today including preparing to see the patient, getting/reviewing separately obtained history, performing a medically appropriate exam/evaluation, counseling and educating, placing orders, referring and communicating with other health care professionals, documenting clinical information in the EHR, independently interpreting results, and coordinating care.   Signed, Diksha Tagliaferro, MD [facility]

## 2024-06-24 NOTE — Progress Notes (Signed)
 Modified Barium Swallow Study  Patient Details  Name: Jackson Lawson MRN: [id number] Date of Birth: Oct 27, 1941  Today's Date: 06/24/2024  Modified Barium Swallow completed.  Full report located under Chart Review in the Imaging Section.  History of Present Illness 83 yo male presenting 8/17 with L sided weakness and slurred speech. 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 PMH includes: CAD, HTN, afib, RA. MRI also showed remote infarcts in the thalami (R>L), L caudate head, and L corona radiata. Esophagram in 2006 indicating reflux and hiatal hernia.   Clinical Impression Pt has an oropharyngeal dysphagia marked by impaired timing, weakness, and reduced sensation. Labial seal is reduced and lingual propulsion is slow, leading to small amounts of anterior loss as well as oral residue that sometimes spills back toward the pharynx. Thin and nectar thick liquids spill to the pyrifrom sinuses before the swallow and because they do, they are able to spill into the airway because he also has reduced hyolaryngeal movement and laryngeal vestibule closure. Airway protection is improved when boluses are better contained above the valleculae before the swallow begins, which happens with honey thick liquids and solids. Attempted a chin tuck posture which can better contain nectar thick liquids, although not when boluses start to increase in size. Sensation of aspiration is volume dependent (mostly PAS 8; one instance of PAS 7 with thin liquids with chin tuck given larger volume). Recommend starting with Dys 2 (finely chopped) diet and honey thick liquids with ongoing SLP f/u warranted.  DIGEST Swallow Severity Rating*  Safety: 3  Efficiency:0  Overall Pharyngeal Swallow Severity: 3 1: mild; 2: moderate; 3: severe; 4: profound  *The Dynamic Imaging Grade of Swallowing Toxicity is standardized for the head and neck cancer population, however, demonstrates promising clinical applications across  populations to standardize the clinical rating of pharyngeal swallow safety and severity.  Factors that may increase risk of adverse event in presence of aspiration Noe & Lianne 2021): Limited mobility;Frail or deconditioned;Dependence for feeding and/or oral hygiene;Weak cough;Aspiration of thick, dense, and/or acidic materials  Swallow Evaluation Recommendations Recommendations: PO diet PO Diet Recommendation: Dysphagia 2 (Finely chopped);Moderately thick liquids (Level 3, honey thick) Liquid Administration via: Cup;Straw Medication Administration: Whole meds with puree Supervision: Staff to assist with self-feeding;Full supervision/cueing for swallowing strategies Swallowing strategies  : Slow rate;Small bites/sips;Check for pocketing or oral holding;Check for anterior loss Postural changes: Position pt fully upright for meals;Stay upright 30-60 min after meals Oral care recommendations: Oral care BID (2x/day)      Leita SAILOR., M.A. CCC-SLP Acute Rehabilitation Services Office: [phone]  Secure chat preferred  06/24/2024,1:33 PM

## 2024-06-24 NOTE — Evaluation (Addendum)
 Clinical/Bedside Swallow Evaluation Patient Details  Name: Jackson Lawson MRN: [id number] Date of Birth: May 21, 1941  Today's Date: 06/24/2024 Time: SLP Start Time (ACUTE ONLY): 0946 SLP Stop Time (ACUTE ONLY): 0959 SLP Time Calculation (min) (ACUTE ONLY): 13 min  Past Medical History: History reviewed. No pertinent past medical history. Past Surgical History: History reviewed. No pertinent surgical history. HPI:  83 yo male presenting 8/17 with L sided weakness and slurred speech. 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 PMH includes: CAD, HTN, afib, RA. MRI also showed remote infarcts in the thalami (R>L), L caudate head, and L corona radiata. Esophagram in 2006 indicating reflux and hiatal hernia.    Assessment / Plan / Recommendation  Clinical Impression  Pt reports coughing with thin liquids that has started recently PTA. Oral motor exam revealed L facial weakness (CN VII) and some lingual weakness but with symmetrical tongue protrusion. Both volitional and reflexive cough sound weak, with prolonged coughing that followed his first cup sip of thin liquids. No other overt signs concerning for aspiration were observed, but given coughing with thin liquids at home and during swallow screen as well, further testing is warranted. Recommend limited ice chips after oral care and meds crushed in puree pending completion of instrumental testing.   SLP Visit Diagnosis: Dysphagia, unspecified (R13.10)    Aspiration Risk       Diet Recommendation NPO except meds;Ice chips PRN after oral care    Medication Administration: Crushed with puree    Other  Recommendations Oral Care Recommendations: Oral care QID;Oral care prior to ice chip/H20     Assistance Recommended at Discharge    Functional Status Assessment Patient has had a recent decline in their functional status and demonstrates the ability to make significant improvements in function in a reasonable and predictable amount of  time.  Frequency and Duration            Prognosis Prognosis for improved oropharyngeal function: Good      Swallow Study   General HPI: 83 yo male presenting 8/17 with L sided weakness and slurred speech. 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 PMH includes: CAD, HTN, afib, RA. MRI also showed remote infarcts in the thalami (R>L), L caudate head, and L corona radiata. Esophagram in 2006 indicating reflux and hiatal hernia. Type of Study: Bedside Swallow Evaluation Previous Swallow Assessment: none in chart Diet Prior to this Study: NPO Temperature Spikes Noted: No Respiratory Status: Room air History of Recent Intubation: No Behavior/Cognition: Alert;Cooperative;Pleasant mood Oral Cavity Assessment: Within Functional Limits Oral Care Completed by SLP: No Oral Cavity - Dentition: Adequate natural dentition;Missing dentition Vision: Functional for self-feeding Self-Feeding Abilities: Total assist Patient Positioning: Upright in bed Baseline Vocal Quality: Normal Volitional Cough: Weak Volitional Swallow: Able to elicit    Oral/Motor/Sensory Function Overall Oral Motor/Sensory Function: Moderate impairment Facial ROM: Reduced left;Suspected CN VII (facial) dysfunction Facial Symmetry: Abnormal symmetry left;Suspected CN VII (facial) dysfunction Facial Strength: Reduced left;Suspected CN VII (facial) dysfunction Lingual ROM: Within Functional Limits Lingual Symmetry: Within Functional Limits Lingual Strength: Reduced;Suspected CN XII (hypoglossal) dysfunction Velum: Within Functional Limits   Ice Chips Ice chips: Within functional limits Presentation: Spoon   Thin Liquid Thin Liquid: Impaired Presentation: Cup;Self Fed;Spoon;Straw Pharyngeal  Phase Impairments: Cough - Immediate    Nectar Thick Nectar Thick Liquid: Not tested   Honey Thick Honey Thick Liquid: Not tested   Puree Puree: Within functional limits Presentation: Self Fed;Spoon   Solid      Solid: Not  tested      Leita SAILOR., M.A. CCC-SLP Acute Rehabilitation Services Office: [phone]  Secure chat preferred  06/24/2024,11:10 AM

## 2024-06-24 NOTE — Progress Notes (Signed)
 After midnight admission  83 year old white male Longstanding persistent A-fib-chronic RBBB CHADVASC >4 on Eliquis -consideration for watchman 02/17/2024 note [facility] Dr. Shirley that note high fall risk) Ascending aortic dilatation 4.2 cm 07/2023 heumatoid arthritis --labs in 2023 suggest this Mood disorder NOS Reflux Senile presbycusis Recent thrombocytopenia per 06/24/2024 PCP note with elevated bilirubin--referred to hepatology  Presented [facility] as code stroke-last known normal midnight Neurology saw CT angio no LVO-per over read?  Nonocclusive thrombus versus artifact versus atherosclerotic web bilateral cervical ICA R >L No need echo 2/2 already Eliquis -holding Eliquis  until MR CT head no acute abnormality Awaiting MR head  Failed swallow study and is on fluids while awaiting SLP input   O/e BP (!) 153/76 (BP Location: Right Arm)   Pulse 96   Temp (!) 97.5 F (36.4 C) (Oral)   Resp 20   Ht 6' 2 (1.88 m)   Wt 73.9 kg   SpO2 100%   BMI 20.92 kg/m   Smooth saccade-no nystagmus vision by direct confrontation seems intact, poor dentition shoulder shrug bilaterally equal Slight droop to left side of mouth--weaker on left side in shoulder girdle--decreased power to biceps and triceps on pushing and pulling-grip strength equivocal bilaterally because of significant rheumatoid changes to fingers and arms S1-S2 seems to be rate controlled A-fib Abdomen soft no rebound Left lower extremity weaker than right grossly at hip and knee flexors and extensors some slight foot drop on the left side onychogryphosis noted  MRI pending-SLP to see today to determine diet  Patient is coherent and after being explained by me-voices preference for DNR although family is debating this-(they have had long conversations about this previously apparently) I will change him to DNR after talking with daughter Vina who is an EMT and his wife Jackson Lawson ]  No charge   Jackson Grimes,  MD [facility] 8:14 AM

## 2024-06-24 NOTE — Progress Notes (Signed)
 Physical Therapy Evaluation Patient Details Name: Jackson Lawson MRN: [id number] DOB: [birth year] Today's Date: 06/24/2024  History of Present Illness  Jackson Lawson is a 83 y.o. male who presented to [facility] ED 06/24/24 as a code stroke. 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 PMH: RA, CAD, HLD, HTN, afib, mitral regurgitation, aortic insufficiency.    Clinical Impression  Pt admitted with above diagnosis. PTA, pt was modI for functional mobility using a SPC, independent with ADLs, and required assist with IADLs. He lives with his wife in a one story house with a threshold to enter and 2 steps inside the house to navigate to bedroom and kitchen. Pt currently with functional limitations due to the deficits listed below (see PT Problem List). He required modA x1-2 for bed mobility and mod-maxA x2 for sit<>stand. Pt demonstrated impaired cognition, poor safety awareness, and decreased activity tolerance. He was unsteady during sitting and standing displayed left lateral and posterior lean. Pt self-limited WBing through LLE which resulted in knee buckling on RLE. Pt will benefit from acute skilled PT to increase his independence and safety with mobility to allow discharge. Recommend intensive inpatient follow-up therapy, >3 hours/day.     If plan is discharge home, recommend the following: Two people to help with walking and/or transfers;A lot of help with bathing/dressing/bathroom;Assistance with cooking/housework;Assist for transportation;Help with stairs or ramp for entrance   Can travel by private vehicle        Equipment Recommendations [place] lift;Wheelchair (measurements PT)  Recommendations for Other Services  Rehab consult    Functional Status Assessment Patient has had a recent decline in their functional status and demonstrates the ability to make significant improvements in function in a reasonable and  predictable amount of time.     Precautions / Restrictions Precautions Precautions: Fall Recall of Precautions/Restrictions: Impaired Precaution/Restrictions Comments: SBP goal: permissive HTN first 24 h < 220/110. Restrictions Weight Bearing Restrictions Per Provider Order: No      Mobility  Bed Mobility Overal bed mobility: Needs Assistance Bed Mobility: Supine to Sit, Sit to Supine     Supine to sit: Mod assist Sit to supine: Mod assist, +2 for safety/equipment   General bed mobility comments: Pt sat up on R side of stretcher with increased time. Cues for sequencing. Assist to bring LLE off EOB and elevate trunk. Returning to bed pt controlled trunk, assist to bring BLE back into bed, and manage lines/leads.    Transfers Overall transfer level: Needs assistance Equipment used: 2 person hand held assist Transfers: Sit to/from Stand Sit to Stand: Mod assist, +2 physical assistance, +2 safety/equipment, Max assist           General transfer comment: Pt stood from stretcher. Assist to power up; once hips cleared from EOB, pt attempted to lift LLE up off floor but denies pain. Poor tolerance of LLE weight bearing and RLE buckling needing knee block. 2nd stand attempt with block to bil sides of L knee and anterior R knee, greater tolerance of WBing.    Ambulation/Gait                  Stairs            Wheelchair Mobility     Tilt Bed    Modified Rankin (Stroke Patients Only) Modified Rankin (Stroke Patients Only) Pre-Morbid Rankin Score: No symptoms Modified Rankin: Moderate disability     Balance Overall balance assessment: Needs assistance Sitting-balance  support: No upper extremity supported, Feet supported, Bilateral upper extremity supported Sitting balance-Leahy Scale: Poor Sitting balance - Comments: min-mod A at EOB; brief bout of CGA. He demonstrated a left lateral lean. Postural control: Left lateral lean, Posterior lean Standing balance  support: Bilateral upper extremity supported, During functional activity Standing balance-Leahy Scale: Poor Standing balance comment: Pt required mod-maxA x2 for static stance and bilateral knee block for support. He demonstrated a posterior lean in standing.                             Pertinent Vitals/Pain Pain Assessment Pain Assessment: Faces Faces Pain Scale: Hurts a little bit Pain Location: Generalized Pain Descriptors / Indicators: Discomfort Pain Intervention(s): Monitored during session, Repositioned    Home Living Family/patient expects to be discharged to:: Private residence Living Arrangements: Spouse/significant other Available Help at Discharge: Family Type of Home: House Home Access: Stairs to enter Entrance Stairs-Rails: None Entrance Stairs-Number of Steps: 1 (threshold) Alternate Level Stairs-Number of Steps: 2 (down to the bedroom and into the kitchen) Home Layout: One level Home Equipment: Cane - single point;Shower seat      Prior Function Prior Level of Function : Independent/Modified Independent;History of Falls (last six months)             Mobility Comments: He sleeps in a recliner chair a lot. Ambulates using SPC. Pt reports he forgets it sometimes. Several falls in the past 6 mo, estimating 1 a month, but wife reports he hasn't fallen recently. ADLs Comments: Indep with ADLs. Wife manages IADLs including houshold chores, cooking, and managing medications. Pt reports he drives very little. Wife states they just don't go anywhere.     Extremity/Trunk Assessment   Upper Extremity Assessment Upper Extremity Assessment: Right hand dominant;LUE deficits/detail (bil dupytren contractures at baseline with family reporting he predominantly has use of 1st and 2nd digits bilaterally) LUE Deficits / Details: generally weak, 2+/5 strength    Lower Extremity Assessment Lower Extremity Assessment: Defer to PT evaluation (denied changes in  sensation, able to detect light touch bilaterally) LLE Deficits / Details: Pt demonstraed limited AROM. PROM WFL. Grossly 2+/5 strength. LLE Sensation: WNL LLE Coordination: decreased gross motor    Cervical / Trunk Assessment Cervical / Trunk Assessment:  (forward rounding of shoulders) Cervical / Trunk Exceptions: Forward Rounding of shoulders  Communication   Communication Communication: Impaired Factors Affecting Communication: Reduced clarity of speech;Difficulty expressing self (difficulty word finding as well, mild slurred speech)    Cognition Arousal: Alert Behavior During Therapy: Flat affect   PT - Cognitive impairments: Initiation, Sequencing, Problem solving, Safety/Judgement                       PT - Cognition Comments: Pt A,Ox4. He demonstrated decreased processing speed. Following commands: Impaired Following commands impaired: Follows one step commands with increased time, Follows multi-step commands inconsistently     Cueing Cueing Techniques: Verbal cues, Gestural cues, Tactile cues     General Comments General comments (skin integrity, edema, etc.): VSS. family reports that wife can be with pt at all times, and other members can fill in if pt were to need more hands on assist around the clock    Exercises     Assessment/Plan    PT Assessment Patient needs continued PT services  PT Problem List Decreased strength;Decreased range of motion;Decreased activity tolerance;Decreased balance;Decreased mobility;Decreased coordination;Decreased cognition;Decreased knowledge of use of DME;Decreased safety awareness;Decreased  knowledge of precautions       PT Treatment Interventions DME instruction;Gait training;Stair training;Functional mobility training;Therapeutic activities;Therapeutic exercise;Balance training;Neuromuscular re-education;Cognitive remediation;Patient/family education    PT Goals (Current goals can be found in the Care Plan section)   Acute Rehab PT Goals Patient Stated Goal: Regain my independence with moving PT Goal Formulation: With patient/family Time For Goal Achievement: 07/08/24 Potential to Achieve Goals: Good    Frequency Min 3X/week     Co-evaluation PT/OT/SLP Co-Evaluation/Treatment: Yes Reason for Co-Treatment: Complexity of the patient's impairments (multi-system involvement);For patient/therapist safety;To address functional/ADL transfers PT goals addressed during session: Mobility/safety with mobility OT goals addressed during session: Strengthening/ROM;ADL's and self-care       AM-PAC PT 6 Clicks Mobility  Outcome Measure Help needed turning from your back to your side while in a flat bed without using bedrails?: A Lot Help needed moving from lying on your back to sitting on the side of a flat bed without using bedrails?: A Lot Help needed moving to and from a bed to a chair (including a wheelchair)?: Total Help needed standing up from a chair using your arms (e.g., wheelchair or bedside chair)?: Total Help needed to walk in hospital room?: Total Help needed climbing 3-5 steps with a railing? : Total 6 Click Score: 8    End of Session Equipment Utilized During Treatment: Gait belt Activity Tolerance: Patient tolerated treatment well;Patient limited by fatigue Patient left: in bed;with call bell/phone within reach;with family/visitor present Nurse Communication: Mobility status PT Visit Diagnosis: Hemiplegia and hemiparesis;Difficulty in walking, not elsewhere classified (R26.2);Other abnormalities of gait and mobility (R26.89) Hemiplegia - Right/Left: Left Hemiplegia - dominant/non-dominant: Non-dominant Hemiplegia - caused by: Cerebral infarction    Time: 9183-9157 PT Time Calculation (min) (ACUTE ONLY): 26 min   Charges:   PT Evaluation $PT Eval Moderate Complexity: 1 Mod   PT General Charges $$ ACUTE PT VISIT: 1 Visit         Randall SAUNDERS, PT, DPT Acute Rehabilitation  Services Office: [phone] Secure Chat Preferred  Jackson Lawson 06/24/2024, 10:22 AM

## 2024-06-24 NOTE — Evaluation (Signed)
 Speech Language Pathology Evaluation Patient Details Name: Jackson Lawson MRN: [id number] DOB: 1941-07-14 Today's Date: 06/24/2024 Time: 9040-8986 SLP Time Calculation (min) (ACUTE ONLY): 14 min  Problem List:  Patient Active Problem List   Diagnosis Date Noted   Left-sided weakness 06/24/2024   Slurred speech 06/24/2024   Left-sided facial droop 06/24/2024   History of CAD (coronary artery disease) 06/24/2024   History of rheumatoid arthritis 06/24/2024   Neurological deficit present 06/24/2024   Essential hypertension 10/02/2019   Hyperlipidemia 10/02/2019   Aortic insufficiency 10/02/2019   Mitral regurgitation 10/02/2019   Coronary artery disease 10/02/2019   Atrial fibrillation (HCC) 10/02/2019   Past Medical History: History reviewed. No pertinent past medical history. Past Surgical History: History reviewed. No pertinent surgical history. HPI:  83 yo male presenting 8/17 with L sided weakness and slurred speech. 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 PMH includes: CAD, HTN, afib, RA. MRI also showed remote infarcts in the thalami (R>L), L caudate head, and L corona radiata. Esophagram in 2006 indicating reflux and hiatal hernia.   Assessment / Plan / Recommendation Clinical Impression  Pt has a moderate dysarthria but with fluent language. He can be understood fairly well by unfamiliar listener when speaking in shorter phrases, with a slower rate. Context also helps with intelligibility, with repetition needed when changing topic. He follows commands well and shows some awareness of acute deficits, but also thinks he may be going home today. Pt will definitely need ongoing SLP services to address speech and communication, with ongoing assessment of higher level cognitive abilities given level of independence PTA.      SLP Assessment  SLP Recommendation/Assessment: Patient needs continued Speech Language Pathology Services SLP Visit Diagnosis: Dysarthria and  anarthria (R47.1)     Assistance Recommended at Discharge  Frequent or constant Supervision/Assistance  Functional Status Assessment Patient has had a recent decline in their functional status and demonstrates the ability to make significant improvements in function in a reasonable and predictable amount of time.  Frequency and Duration min 2x/week  2 weeks      SLP Evaluation Cognition  Overall Cognitive Status: Impaired/Different from baseline Arousal/Alertness: Awake/alert Orientation Level: Oriented X4 Attention: Sustained Sustained Attention: Appears intact Awareness: Impaired Awareness Impairment: Other (comment) (online) Safety/Judgment: Impaired Comments: flat affect       Comprehension  Auditory Comprehension Overall Auditory Comprehension: Appears within functional limits for tasks assessed    Expression Expression Primary Mode of Expression: Verbal Verbal Expression Overall Verbal Expression: Appears within functional limits for tasks assessed   Oral / Motor  Oral Motor/Sensory Function Overall Oral Motor/Sensory Function: Moderate impairment Facial ROM: Reduced left;Suspected CN VII (facial) dysfunction Facial Symmetry: Abnormal symmetry left;Suspected CN VII (facial) dysfunction Facial Strength: Reduced left;Suspected CN VII (facial) dysfunction Lingual ROM: Within Functional Limits Lingual Symmetry: Within Functional Limits Lingual Strength: Reduced Velum: Within Functional Limits Motor Speech Overall Motor Speech: Impaired at baseline Respiration: Within functional limits Phonation: Normal Resonance: Within functional limits Articulation: Impaired Level of Impairment: Word Intelligibility: Intelligibility reduced Word: 75-100% accurate Phrase: 75-100% accurate Effective Techniques: Slow rate;Increased vocal intensity            Leita SAILOR., M.A. CCC-SLP Acute Rehabilitation Services Office: [phone]  Secure chat preferred  06/24/2024, 11:15  AM

## 2024-06-24 NOTE — Progress Notes (Signed)
 STROKE TEAM PROGRESS NOTE   BRIEF HPI Mr. Jackson Lawson is a 83 y.o. male with history of CAD, HTN, afib on Eliquis , and RA presenting with left-sided weakness.  Patient reports compliance with his Eliquis  PTA.    SIGNIFICANT HOSPITAL EVENTS 8/17 presented with left-sided weakness, admitted for stroke work up  [facility] without acute intracranial abnormality, ASPECTS 10. 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  - CTA no LVO, focal areas of decreased contrast enhancement in both proximal ICAs secondary to turbulent flow versus non-occlusive atherosclerotic webs or partially adherent thrombus more prominent on the right.  INTERIM HISTORY/SUBJECTIVE Patient's daughters and wife at bedside this morning.  Patient endorses ongoing left-sided weakness and slurred speech.  Vital signs stable.  MRI brain with acute/subacute nonhemorrhagic right paramedian pontine infarct measuring 8 x 13 mm with associated mild T2 signal changes.  Remote infarcts in the thalami right greater than left, left caudate head, and left corona radiata.  OBJECTIVE CBC    Component Value Date/Time   WBC 9.9 06/24/2024 0642   RBC 4.63 06/24/2024 0642   HGB 13.9 06/24/2024 0642   HGB 14.6 09/10/2022 1532   HGB 16.3 09/09/2008 0800   HCT 42.8 06/24/2024 0642   HCT 46.9 09/09/2008 0800   PLT 89 (L) 06/24/2024 0642   PLT 127 (L) 09/10/2022 1532   PLT 122 (L) 09/09/2008 0800   MCV 92.4 06/24/2024 0642   MCV 92.9 09/09/2008 0800   MCH 30.0 06/24/2024 0642   MCHC 32.5 06/24/2024 0642   RDW 13.6 06/24/2024 0642   RDW 14.7 (H) 09/09/2008 0800   LYMPHSABS 2.1 06/24/2024 0342   LYMPHSABS 1.2 09/09/2008 0800   MONOABS 0.9 06/24/2024 0342   MONOABS 0.5 09/09/2008 0800   EOSABS 1.0 (H) 06/24/2024 0342   EOSABS 0.9 (H) 09/09/2008 0800   BASOSABS 0.1 06/24/2024 0342   BASOSABS 0.0 09/09/2008 0800   BMET    Component Value Date/Time   NA 139 06/24/2024 0642   K 3.8  06/24/2024 0642   CL 102 06/24/2024 0642   CO2 25 06/24/2024 0642   GLUCOSE 149 (H) 06/24/2024 0642   BUN 14 06/24/2024 0642   CREATININE 1.07 06/24/2024 0642   CREATININE 0.88 09/10/2022 1532   CALCIUM  9.1 06/24/2024 0642   GFRNONAA >60 06/24/2024 0642   GFRNONAA >60 09/10/2022 1532   IMAGING past 24 hours MR ANGIO HEAD WO CONTRAST Result Date: 06/24/2024 EXAM: MR Angiography Head without intravenous Contrast. 06/24/2024 07:03:00 AM TECHNIQUE: Magnetic resonance angiography images of the head without intravenous contrast. Multiplanar 2D and 3D reformatted images are provided for review. 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 Abnormal CT angiography of the neck. SABRA FINDINGS: ANTERIOR CIRCULATION: The left A1 segment is dominant. Mild atherosclerotic changes are present within the cavernous internal carotid arteries bilaterally without significant stenoses. No aneurysm. POSTERIOR CIRCULATION: No significant stenosis of the posterior cerebral arteries. No significant stenosis of the basilar artery. No significant stenosis of the vertebral arteries. No aneurysm. IMPRESSION: 1. No significant stenosis of the intracranial vasculature. 2. Mild atherosclerotic changes within the cavernous internal carotid arteries bilaterally. Electronically signed by: Lonni Necessary MD 06/24/2024 08:23 AM EDT RP Workstation: [id number]   MR ANGIO NECK W WO CONTRAST Result Date: 06/24/2024 EXAM: MRA Neck without and with contrast 06/24/2024 07:05:00 AM TECHNIQUE: Multiplanar multisequence MRA of the neck was performed without and with the administration  of 7 mL intravenous gadobutrol  (GADAVIST ) 1 MMOL/ML. 2D and 3D reformatted images are provided for review. Stenosis of the internal carotid arteries is measured using NASCET criteria. 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 FINDINGS: CAROTID ARTERIES: Noncontrast time-of-flight images demonstrate signal loss in the proximal internal and external carotid arteries bilaterally with significant motion artifact at this level. Post contrast images demonstrate no significant stenosis or web. Some venous contamination is present. Moderate tortuosity is present in the mid cervical ICA bilaterally without significant stenosis. VERTEBRAL ARTERIES: Flow is antegrade in the vertebral arteries bilaterally. No focal stenosis is present. IMPRESSION: 1. No hemodynamically significant stenosis or dissection of the neck arteries. 2. Moderate tortuosity in the mid cervical ICA bilaterally without significant stenosis. Electronically signed by: Lonni Necessary MD 06/24/2024 08:21 AM EDT RP Workstation: [id number]   MR BRAIN WO CONTRAST Result Date: 06/24/2024 EXAM: MRI BRAIN WITHOUT CONTRAST 06/24/2024 07:03:47 AM TECHNIQUE: Multiplanar multisequence MRI of the head/brain was performed without the administration of intravenous contrast. COMPARISON: None available. CLINICAL HISTORY: Neuro deficit, acute, stroke suspected. 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 No acute supratentorial infarct is present. Mild T2 signal changes are associated with the acute infarct. Remote infarcts are present in the thalami, right greater than left. A remote lacunar infarct is present in the left caudate head. Remote lacunar infarcts are also present in the left corona radiata. Moderate atrophy and white matter disease are present. Dilated perivascular spaces are present throughout the basal ganglia bilaterally. Scattered foci of susceptibility are present throughout the basal ganglia and thalami bilaterally. Other scattered foci are present in the parietal greater than frontal lobes. A focal area of susceptibility in the  medial left temporal lobe measures 12 mm. No mass. No midline shift. No hydrocephalus. The sella is unremarkable. Normal flow voids. ORBITS: Bilateral lens replacements are noted. The globes and orbits are otherwise within normal limits. SINUSES AND MASTOIDS: No acute abnormality. BONES AND SOFT TISSUES: Normal marrow signal. No acute soft tissue abnormality. IMPRESSION: 1. 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 No acute supratentorial infarct. 2. Remote infarcts in the thalami (right greater than left), left caudate head, and left corona radiata. 3. Moderate atrophy and white matter disease. Electronically signed by: Lonni Necessary MD 06/24/2024 08:18 AM EDT RP Workstation: [id number]   CT ANGIO HEAD NECK W WO CM (CODE STROKE) Result Date: 06/24/2024 EXAM: CTA HEAD AND NECK WITH AND WITHOUT 06/24/2024 03:51:49 AM TECHNIQUE: CTA of the head and neck was performed with and without the administration of intravenous contrast. Multiplanar 2D and/or 3D reformatted images are provided for review. Automated exposure control, iterative reconstruction, and/or weight based adjustment of the mA/kV was utilized to reduce the radiation dose to as low as reasonably achievable. Stenosis of the internal carotid arteries measured using NASCET criteria. COMPARISON: None available CLINICAL HISTORY: Neuro deficit, acute, stroke suspected. Lt side weakness, lt facial droop, slurred speech FINDINGS: CTA NECK: AORTIC ARCH AND ARCH VESSELS: Calcific aortic atherosclerosis. No dissection or arterial injury. No significant stenosis of the brachiocephalic or subclavian arteries. CERVICAL CAROTID ARTERIES: Bilaterally there are areas of decreased opacification within the proximal internal carotid arteriesy which may be due to turbulent blood flow, though a non-occlusive atherosclerotic web could also have this appearance. The finding on the right is  more convincing than that  on the left. No hemodynamically significant stenosis of either internal carotid artery. Bilateral tortuous distal internal carotid arteries. CERVICAL VERTEBRAL ARTERIES: No dissection, arterial injury, or significant stenosis. LUNGS AND MEDIASTINUM: Unremarkable. SOFT TISSUES: No acute abnormality. BONES: No acute abnormality. CTA HEAD: ANTERIOR CIRCULATION: Atherosclerotic calcification of the cavernous segments of both internal carotid arteries without hemodynamically significant stenosis. No significant stenosis of the internal carotid arteries. No significant stenosis of the anterior cerebral arteries. No significant stenosis of the middle cerebral arteries. No aneurysm. POSTERIOR CIRCULATION: No significant stenosis of the posterior cerebral arteries. No significant stenosis of the basilar artery. No significant stenosis of the vertebral arteries. No aneurysm. OTHER: No dural venous sinus thrombosis on this non-dedicated study. IMPRESSION: 1. No large vessel occlusion, hemodynamically significant stenosis, or aneurysm in the head or neck. 2. Focal areas of decreased contrast enhancement in both proximal internal carotid arteries may be secondary to turbulent flow, though non-occlusive atherosclerotic webs or partially adherent thrombus may also have this appearance. The finding on the right is more convincing. 3. Case discussed with Dr. Vanessa at 4:00 am on 06/24/24. Electronically signed by: Franky Stanford MD 06/24/2024 04:05 AM EDT RP Workstation: [id number]   CT HEAD CODE STROKE WO CONTRAST Result Date: 06/24/2024 EXAM: CT HEAD WITHOUT CONTRAST 06/24/2024 03:45:22 AM TECHNIQUE: CT of the head was performed without the administration of intravenous contrast. Automated exposure control, iterative reconstruction, and/or weight based adjustment of the mA/kV was utilized to reduce the radiation dose to as low as reasonably achievable. COMPARISON: 03/30/2014 CLINICAL HISTORY: Neuro  deficit, acute, stroke suspected. Stroke, Lt side facial droop, slurred speech; Dr. Aron FINDINGS: BRAIN AND VENTRICLES: No acute hemorrhage. Gray-white differentiation is preserved. No hydrocephalus. No extra-axial collection. No mass effect or midline shift. Chronic ischemic white matter changes. 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 ASPECTS is 10. ORBITS: No acute abnormality. SINUSES: No acute abnormality. SOFT TISSUES AND SKULL: No acute soft tissue abnormality. No skull fracture. IMPRESSION: 1. No acute intracranial abnormality. ASPECTS is 10. 2. 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 3. Findings communicated to Dr. Sal Khaliqdina at 3:49 am on 06/24/24. Electronically signed by: Franky Stanford MD 06/24/2024 03:50 AM EDT RP Workstation: [id number]   Vitals:   06/24/24 0815 06/24/24 0830 06/24/24 0900 06/24/24 0915  BP:  (!) 148/92 123/77 (!) 154/75  Pulse:  97  81  Resp:   18 18  Temp: 97.8 F (36.6 C)     TempSrc: Oral     SpO2:  99% 100% 100%  Weight:      Height:       PHYSICAL EXAM General:  Alert, well-nourished, well-developed patient in no acute distress Psych:  Mood and affect appropriate for situation.  Calm and cooperative on exam CV: Atrial fibrillation on cardiac monitor Respiratory:  Regular, unlabored respirations on room air GI: Abdomen soft and nontender  NEURO:  Mental Status: AA&Ox3, patient is able to give clear and coherent history Speech/Language: speech is dysarthric but without aphasia.  Naming, repetition, fluency, and comprehension intact.  Cranial Nerves:  II: PERRL. Visual fields full.  III, IV, VI: EOMI.  V: Sensation is intact to light touch and symmetrical to face.  VII: Left facial weakness VIII: Hearing is intact to voice. IX, X: Palate elevates symmetrically.  Hypophonic. XI: Shoulder shrug 5/5. XII: Tongue protrudes left of midline. Motor: Bilateral upper extremities are  able to elevate antigravity with right arm stronger than the left.  The left arm is able to elevate antigravity with vertical drift and wobbling.  Grip strength bilaterally impaired with right > left though patient reports arthritic changes limiting his grip strength at baseline.  Bilateral lower extremities elevate antigravity with right lower extremity with greater elevation than the left. LLE is able to elevate minimally antigravity with vertical drift.  Tone: is normal and bulk is normal Sensation: Intact to light touch bilaterally. Extinction absent to light touch to DSS.   Coordination: FTN intact on the right upper extremity and with limited participation on the left due to weakness. HKS: unable to perform (patient shakes his head no when asked to perform) Gait: Deferred  ASSESSMENT/PLAN Mr. Jackson Lawson is a 83 y.o. male with history of CAD, HTN, afib on Eliquis , and RA presenting with left-sided weakness.   Stroke: Acute/subacute right pontine ischemic infarct, etiology likely small vessel disease  CT head no acute intracranial abnormality.  Aspects is 10.  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 CTA head & neck no LVO or hemodynamically significant stenosis.  Focal areas of decreased contrast enhancement in both proximal internal carotid arteries may be secondary to turbulent flow, though nonocclusive atherosclerotic webs or partially adherent thrombus may also have this appearance.  The finding is more convincing on the right.  MRI acute/subacute nonhemorrhagic right paramedian pontine infarct measuring 8 x 13 mm with associated mild T2 signal changes.  No acute supratentorial infarct.  Remote infarcts in the thalami (right greater than left), left caudate head, and left corona radiata.  Moderate atrophy and white matter disease. MRA head no significant stenosis of the intracranial vasculature.  Mild atherosclerotic changes within the  cavernous internal carotid arteries bilaterally. MRA neck no hemodynamically significant stenosis or dissection of the neck arteries.  Moderate tortuosity in the mid cervical ICA bilaterally without significant stenosis. 2D Echo pending LDL 80 HgbA1c 6.0 VTE prophylaxis - on Eliquis  Eliquis  (apixaban ) daily prior to admission, now on aspirin  81 mg daily and Eliquis  (apixaban ) daily. Continue on discharge. Therapy recommendations:  CIR Disposition:  Pending   Atrial fibrillation Home Meds: Eliquis , compliant dosing PTA per patient  Continue telemetry monitoring Continue anticoagulation with Eliquis    Hypertension Home meds: Norvasc, Hyzaar  BP stable Long-term blood pressure goal: Normotension   Hyperlipidemia Home meds:  none LDL 80, goal < 70 Add crestor  10 mg PO daily   High intensity statin not indicated as patient is at near goal and advanced age Continue statin at discharge  Dysphagia Patient has post-stroke dysphagia SLP consulted On dys 2 ad thin liquid Advance diet as tolerated  Other Stroke Risk Factors Advanced age 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 Pt was seen and examined.   Wife and two daughters are at the bedside. Pt reclining in bed, lethargic but awake alert, eyes open, orientated to age, place, time and people. No aphasia, paucity of speech but answer question appropriately, following all simple commands. Able to name and repeat in dysarthric voice. No gaze palsy, tracking bilaterally, visual field full.  Mild left facial droop. Tongue protrusion to the left.  Right upper and lower extremity  5/5, left upper extremity 3/5 and left lower extremity 3 -/5. Sensation symmetrical bilaterally, right FTN intact, left finger-to-nose incomplete but no obvious ataxia, gait not tested.   Patient stroke concerning for small vessel disease based on location.  Recommend to add aspirin  81 on top of Eliquis .  Started low-dose statin for LDL more than 70.  PT and OT recommend CIR.  For detailed assessment and plan, please refer to above as I have made changes wherever appropriate.   Neurology will sign off. Please call with questions. Pt will follow up with stroke clinic NP at [facility] in about 4 weeks. Thanks for the consult.  Ary Cummins, MD PhD Stroke Neurology 06/24/2024 3:06 PM  I spent additional 30 minutes inpatient face-to-face time with the patient and family, reviewing test results, images and medication, and discussing the diagnosis, treatment plan and potential prognosis. This patient's care requiresreview of multiple databases, neurological assessment, discussion with family, other specialists and medical decision making of high complexity.    To contact Stroke Continuity provider, please refer to [email or web address]. After hours, contact General Neurology

## 2024-06-24 NOTE — ED Notes (Signed)
 RN completed NIH 5 minutes early d/t pt being transported to swallow screen study

## 2024-06-24 NOTE — ED Notes (Signed)
 CCMD contacted to place the patient on cardiac monitoring services.

## 2024-06-24 NOTE — Progress Notes (Signed)

## 2024-06-24 NOTE — H&P (Signed)
 History and Physical    Jackson Lawson FMW:[id number] DOB: [birth year] DOA: 06/24/2024  PCP: Montey Lot, PA-C   Patient coming from: Home   Chief Complaint:  Chief Complaint  Patient presents with   Code Stroke   ED TRIAGE note: Patient was presented to ED via EMS as a code stroke  HPI:  Jackson Lawson is a 83 y.o. male with medical history significant of paroxysmal tribulation, essential hypertension, hyperlipidemia and CAD presented to emergency department with complaining of left-sided weakness, left-sided facial droop and slurred speech started around 02:15 a.m.  Last known well around 7 PM 8/16 last night when the patient went to bed.  Patient reported that he went to bed midnight and woke up middle of the night with sudden onset of above symptoms.  Patient's wife called EMS and brought to ED as a code stroke. Last dose of Eliquis  8/16 around evening. Patient denies any sensory change, tremor, tingling, dizziness, headache, generalized weakness, loss of consciousness and seizure. He is resting comfortably in the bed.  Having some hearing difficulty which is at baseline. Wife reported that patient is independent for ADLS.   At presentation to ED patient is hemodynamically stable. CBC, CMP unremarkable.  Slightly elevated pro time INR.  Normal blood alcohol level.  Pending UDS.  CT head no acute intracranial abnormality. 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  CT angio head and neck no large vessel occlusion. Focal areas of decreased contrast enhancement in both proximal internal carotid arteries may be secondary to turbulent flow, though non-occlusive atherosclerotic webs or partially adherent thrombus may also have this appearance. The finding on the right is more convincing. 3. Case discussed with Dr. Vanessa at 4:00 am on 06/24/24.  Neurology Dr.Khaliqdina has been evaluated patient in the ED recommended to obtain  MRI of the brain, MRA angio head and neck and MR angio neck with and without contrast to clarify nonocclusive thrombosis versus cavitate wave on CTA. Neurology recommendation is following:  Frequent Neuro checks per stroke unit protocol - Recommend brain imaging with MRI Brain without contrast - Recommend Vascular imaging with MRA Angio Head without contrast and MR angio neck with and without contrast.  This is to clarify concern for nonocclusive thrombus versus carotid web on CTA. -No need for TTE from a stroke standpoint as patient is already on Eliquis . - Recommend obtaining Lipid panel with LDL - Please start statin if LDL > 70 - Recommend HbA1c to evaluate for diabetes and how well it is controlled. - Hold off on Eliquis .  Timing of resuming Eliquis  based on size of the stroke on the MRI. - SBP goal - permissive hypertension first 24 h < 220/110. Held home meds.  - Recommend Telemetry monitoring for arrythmia - Recommend bedside swallow screen prior to PO intake. - Stroke education booklet - Recommend PT/OT/SLP consult   RN reported that in the ED patient failed swallow screen. Hospitalist has been consulted for further evaluation management for left-sided weakness, left-sided facial droop and slurred speech concern for CVA.    Significant labs in the ED: Lab Orders         Ethanol         Protime-INR         APTT         CBC         Differential         Comprehensive metabolic panel  Urine rapid drug screen (hosp performed)         Lipid panel         Hemoglobin A1c         CBC         Comprehensive metabolic panel         I-stat chem 8, ED         CBG monitoring, ED       Review of Systems:  Review of Systems  Constitutional:  Negative for chills, fever and weight loss.  Eyes:  Negative for blurred vision and double vision.  Respiratory:  Negative for cough.   Cardiovascular:  Negative for chest pain and palpitations.  Musculoskeletal:  Negative for  myalgias.  Neurological:  Positive for speech change and focal weakness. Negative for dizziness, tingling, tremors, sensory change, seizures, loss of consciousness, weakness and headaches.  Psychiatric/Behavioral:  The patient is not nervous/anxious.     History reviewed. No pertinent past medical history.  History reviewed. No pertinent surgical history.   reports that he has never smoked. He has never used smokeless tobacco. No history on file for alcohol use and drug use.  Allergies  Allergen Reactions   Fenofibrate Diarrhea   Simvastatin Diarrhea    History reviewed. No pertinent family history.  Prior to Admission medications   Medication Sig Start Date End Date Taking? Authorizing Provider  amLODipine (NORVASC) 10 MG tablet Take 10 mg by mouth daily. 07/24/19   [provider]  apixaban  (ELIQUIS ) 5 MG TABS tablet Take 5 mg by mouth 2 (two) times daily.    [provider]  Ascorbic Acid (VITAMIN C) 100 MG tablet Take by mouth. Patient not taking: Reported on 09/10/2022    [provider]  aspirin  EC 81 MG tablet Take by mouth. Patient not taking: Reported on 09/10/2022    [provider]  betamethasone, augmented, (DIPROLENE) 0.05 % gel betamethasone, augmented 0.05 % topical gel Patient not taking: Reported on 09/10/2022    [provider]  cefUROXime (CEFTIN) 500 MG tablet Take 500 mg by mouth 2 (two) times daily. Patient not taking: Reported on 09/10/2022 09/12/19   [provider]  Cholecalciferol (VITAMIN D -1000 MAX ST) 25 MCG (1000 UT) tablet Take by mouth. Patient not taking: Reported on 09/10/2022    [provider]  FLUoxetine  (PROZAC ) 20 MG capsule Take 20 mg by mouth daily. 11/05/20   [provider]  leflunomide (ARAVA) 20 MG tablet Take 20 mg by mouth daily. Patient not taking: Reported on 09/10/2022 07/24/19   [provider]  levofloxacin (LEVAQUIN) 500 MG tablet Take 500 mg by mouth  daily. Patient not taking: Reported on 09/10/2022 09/14/19   [provider]  losartan-hydrochlorothiazide (HYZAAR) 100-25 MG tablet Take by mouth. Patient not taking: Reported on 09/10/2022 06/12/19   [provider]  metoprolol tartrate (LOPRESSOR) 50 MG tablet metoprolol tartrate 50 mg tablet    [provider]  naproxen sodium (ALEVE) 220 MG tablet Take by mouth. Patient not taking: Reported on 09/10/2022    [provider]  omeprazole (PRILOSEC) 40 MG capsule Take 40 mg by mouth daily. Patient not taking: Reported on 09/10/2022 08/13/19   [provider]  predniSONE  (DELTASONE ) 5 MG tablet Take 1 tablet (5 mg total) by mouth daily with breakfast. If tolerated for 7 days may increase dose to 10 mg po daily with breakfast 09/10/22   Onesimo Emaline Brink, MD     Physical Exam: Vitals:   06/24/24  0300 06/24/24 0413 06/24/24 0414 06/24/24 0417  BP:  (!) 153/76    Pulse:  96    Resp:  20    Temp:  (!) 97.5 F (36.4 C)    TempSrc:  Oral    SpO2:  96%  100%  Weight: 73.9 kg  73.9 kg   Height: 6' 2 (1.88 m)  6' 2 (1.88 m)     Physical Exam Vitals and nursing note reviewed.  Constitutional:      General: He is not in acute distress.    Appearance: He is not ill-appearing.  HENT:     Mouth/Throat:     Mouth: Mucous membranes are moist.  Eyes:     Pupils: Pupils are equal, round, and reactive to light.  Cardiovascular:     Rate and Rhythm: Normal rate. Rhythm irregular.     Pulses: Normal pulses.     Heart sounds: Normal heart sounds.  Pulmonary:     Effort: Pulmonary effort is normal.     Breath sounds: Normal breath sounds.  Musculoskeletal:     Cervical back: Neck supple.  Skin:    General: Skin is dry.     Capillary Refill: Capillary refill takes less than 2 seconds.  Neurological:     Mental Status: He is alert and oriented to person, place, and time.     Cranial Nerves: No cranial nerve deficit.     Sensory: No sensory deficit.      Motor: Weakness present.     Coordination: Coordination abnormal.  Psychiatric:        Mood and Affect: Mood normal.      Labs on Admission: I have personally reviewed following labs and imaging studies  CBC: Recent Labs  Lab 06/24/24 0342 06/24/24 0347  WBC 10.0  --   NEUTROABS 5.9  --   HGB 14.2 15.6  HCT 43.6 46.0  MCV 92.2  --   PLT 96*  --    Basic Metabolic Panel: Recent Labs  Lab 06/24/24 0342 06/24/24 0347  NA 138 140  K 3.3* 3.2*  CL 102 104  CO2 23  --   GLUCOSE 136* 135*  BUN 15 18  CREATININE 1.10 1.00  CALCIUM  9.1  --    GFR: Estimated Creatinine Clearance: 57.5 mL/min (by C-G formula based on SCr of 1 mg/dL). Liver Function Tests: Recent Labs  Lab 06/24/24 0342  AST 17  ALT 10  ALKPHOS 59  BILITOT 0.4  PROT 8.0  ALBUMIN 3.4*   No results for input(s): LIPASE, AMYLASE in the last 168 hours. No results for input(s): AMMONIA in the last 168 hours. Coagulation Profile: Recent Labs  Lab 06/24/24 0342  INR 1.3*   Cardiac Enzymes: No results for input(s): CKTOTAL, CKMB, CKMBINDEX, TROPONINI, TROPONINIHS in the last 168 hours. BNP (last 3 results) No results for input(s): BNP in the last 8760 hours. HbA1C: No results for input(s): HGBA1C in the last 72 hours. CBG: Recent Labs  Lab 06/24/24 0339  GLUCAP 140*   Lipid Profile: No results for input(s): CHOL, HDL, LDLCALC, TRIG, CHOLHDL, LDLDIRECT in the last 72 hours. Thyroid  Function Tests: No results for input(s): TSH, T4TOTAL, FREET4, T3FREE, THYROIDAB in the last 72 hours. Anemia Panel: No results for input(s): VITAMINB12, FOLATE, FERRITIN, TIBC, IRON, RETICCTPCT in the last 72 hours. Urine analysis: No results found for: COLORURINE, APPEARANCEUR, LABSPEC, PHURINE, GLUCOSEU, HGBUR, BILIRUBINUR, KETONESUR, PROTEINUR, UROBILINOGEN, NITRITE, LEUKOCYTESUR  Radiological Exams on Admission: I have personally  reviewed images CT ANGIO  HEAD NECK W WO CM (CODE STROKE) Result Date: 06/24/2024 EXAM: CTA HEAD AND NECK WITH AND WITHOUT 06/24/2024 03:51:49 AM TECHNIQUE: CTA of the head and neck was performed with and without the administration of intravenous contrast. Multiplanar 2D and/or 3D reformatted images are provided for review. Automated exposure control, iterative reconstruction, and/or weight based adjustment of the mA/kV was utilized to reduce the radiation dose to as low as reasonably achievable. Stenosis of the internal carotid arteries measured using NASCET criteria. COMPARISON: None available CLINICAL HISTORY: Neuro deficit, acute, stroke suspected. Lt side weakness, lt facial droop, slurred speech FINDINGS: CTA NECK: AORTIC ARCH AND ARCH VESSELS: Calcific aortic atherosclerosis. No dissection or arterial injury. No significant stenosis of the brachiocephalic or subclavian arteries. CERVICAL CAROTID ARTERIES: Bilaterally there are areas of decreased opacification within the proximal internal carotid arteriesy which may be due to turbulent blood flow, though a non-occlusive atherosclerotic web could also have this appearance. The finding on the right is more convincing than that on the left. No hemodynamically significant stenosis of either internal carotid artery. Bilateral tortuous distal internal carotid arteries. CERVICAL VERTEBRAL ARTERIES: No dissection, arterial injury, or significant stenosis. LUNGS AND MEDIASTINUM: Unremarkable. SOFT TISSUES: No acute abnormality. BONES: No acute abnormality. CTA HEAD: ANTERIOR CIRCULATION: Atherosclerotic calcification of the cavernous segments of both internal carotid arteries without hemodynamically significant stenosis. No significant stenosis of the internal carotid arteries. No significant stenosis of the anterior cerebral arteries. No significant stenosis of the middle cerebral arteries. No aneurysm. POSTERIOR CIRCULATION: No significant stenosis of the posterior  cerebral arteries. No significant stenosis of the basilar artery. No significant stenosis of the vertebral arteries. No aneurysm. OTHER: No dural venous sinus thrombosis on this non-dedicated study. IMPRESSION: 1. No large vessel occlusion, hemodynamically significant stenosis, or aneurysm in the head or neck. 2. Focal areas of decreased contrast enhancement in both proximal internal carotid arteries may be secondary to turbulent flow, though non-occlusive atherosclerotic webs or partially adherent thrombus may also have this appearance. The finding on the right is more convincing. 3. Case discussed with Dr. Vanessa at 4:00 am on 06/24/24. Electronically signed by: Franky Stanford MD 06/24/2024 04:05 AM EDT RP Workstation: [id number]   CT HEAD CODE STROKE WO CONTRAST Result Date: 06/24/2024 EXAM: CT HEAD WITHOUT CONTRAST 06/24/2024 03:45:22 AM TECHNIQUE: CT of the head was performed without the administration of intravenous contrast. Automated exposure control, iterative reconstruction, and/or weight based adjustment of the mA/kV was utilized to reduce the radiation dose to as low as reasonably achievable. COMPARISON: 03/30/2014 CLINICAL HISTORY: Neuro deficit, acute, stroke suspected. Stroke, Lt side facial droop, slurred speech; Dr. Aron FINDINGS: BRAIN AND VENTRICLES: No acute hemorrhage. Gray-white differentiation is preserved. No hydrocephalus. No extra-axial collection. No mass effect or midline shift. Chronic ischemic white matter changes. 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 ASPECTS is 10. ORBITS: No acute abnormality. SINUSES: No acute abnormality. SOFT TISSUES AND SKULL: No acute soft tissue abnormality. No skull fracture. IMPRESSION: 1. No acute intracranial abnormality. ASPECTS is 10. 2. 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 3. Findings communicated to Dr. Sal Khaliqdina at 3:49 am on 06/24/24. Electronically signed by: Franky Stanford MD 06/24/2024 03:50 AM EDT RP Workstation: [id number]     EKG: My personal interpretation of EKG shows: Atrial fibrillation rate controlled to 70 and right bundle branch block.    Assessment/Plan: Principal Problem:   Neurological deficit present Active Problems:   Slurred speech   Essential  hypertension   Hyperlipidemia   Left-sided weakness   Left-sided facial droop   History of CAD (coronary artery disease)   History of rheumatoid arthritis    Assessment and Plan: Acute neurological deficit Left-sided weakness Left-sided facial droop Dysarthria -Patient presenting to emergency department complaining of left-sided upper lower extremity weakness, left-sided facial droop and slurred speech.  Last known well 7 PM 8/16 and took Eliquis  around 6 PM. -NIHSS stroke scale is 6. -At presentation to ED patient is hemodynamically stable blood pressure is borderline elevated.  CBC, CMP unremarkable. -CT head no acute intracranial abnormality - CT angio head and neck no large vessel occlusion, significant stenosis or aneurysm. Focal areas of decreased contrast enhancement in both proximal internal carotid arteries may be secondary to turbulent flow, though non-occlusive atherosclerotic webs or partially adherent thrombus may also have this appearance. - Neurology evaluated patient in the emergency department recommending to obtain MRI brain, MRA angio head without contrast and MRA angio neck with and without contrast to clarify consult for nonocclusive thrombosis vs carotid web on CTA. Per neurology hold Eliquis  and help to decide timing of resuming Eliquis  based on size of the stroke on MRI. -Unfortunately patient failed swallow screen. - Continue neurocheck every 4 hours, checking A1c and lipid panel. -Continue permissive hypertension for next 24 hours goal blood pressure<220/110.  -Obtaining MRI of the brain, MRA head wo contrast and MRA angio neck w/wo contrast. -Consulted speech  therapist, PT and OT. -Keeping patient n.p.o. and continue aspiration precaution.  Starting low-dose D5 LR 50 cc/h until  can figure out when and how to feed patient.   Paroxysmal atrial fibrillation  -Holding Eliquis  as based on the MRI of the brain result neurology will decide timing of resuming Eliquis . EKG showing atrial fibrillation rate controlled to 70 and right bundle branch block.   Essential hypertension Hold home blood pressure regimen for 24 hours for permissive hypertension.  Hyperlipidemia History of CAD -Holding home medications as patient failed swallow screen and NPO.  Rheumatoid arthritis -Wife reported that patient is not any medication for rheumatoid arthritis anymore.    DVT prophylaxis:  SCDs Code Status:  Full Code Diet: npo. Family Communication:   Family was present at bedside, at the time of interview. Opportunity was given to ask question and all questions were answered satisfactorily.  Disposition Plan: Based on the MRI, MRA head and MRA neck neurology will decide further anticoagulation/antiplatelet therapy. Consults: PT, OT and SLP therapy. Admission status:   Inpatient, Telemetry bed  Severity of Illness: The appropriate patient status for this patient is INPATIENT. Inpatient status is judged to be reasonable and necessary in order to provide the required intensity of service to ensure the patient's safety. The patient's presenting symptoms, physical exam findings, and initial radiographic and laboratory data in the context of their chronic comorbidities is felt to place them at high risk for further clinical deterioration. Furthermore, it is not anticipated that the patient will be medically stable for discharge from the hospital within 2 midnights of admission.   * I certify that at the point of admission it is my clinical judgment that the patient will require inpatient hospital care spanning beyond 2 midnights from the point of admission due to high  intensity of service, high risk for further deterioration and high frequency of surveillance required.Jackson Lawson    Buell Parcel, MD [facility]  How to contact the [facility] Attending or Consulting provider 7A - 7P or covering provider during after hours 7P -7A,  for this patient.  Check the care team in [facility] and look for a) attending/consulting [facility] provider listed and b) the [facility] team listed Log into [email or web address] and use [facility] universal password to access. If you do not have the password, please contact the hospital operator. Locate the [facility] provider you are looking for under [facility] and page to a number that you can be directly reached. If you still have difficulty reaching the provider, please page the [facility] (Director on Call) for the Hospitalists listed on amion for assistance.  06/24/2024, 5:06 AM

## 2024-06-24 NOTE — ED Provider Notes (Signed)
 [facility] AT [facility] Provider Note   CSN: [id number] Arrival date & time: 06/24/24  9663  An emergency department physician performed an initial assessment on this suspected stroke patient at 0336.  Patient presents with: Code Stroke   Jackson Lawson is a 83 y.o. male.   The history is provided by the EMS personnel. The history is limited by the condition of the patient.  Cerebrovascular Accident This is a new problem. The current episode started 1 to 2 hours ago. The problem occurs constantly. The problem has not changed since onset.Pertinent negatives include no chest pain, no abdominal pain, no headaches and no shortness of breath. Nothing aggravates the symptoms. Nothing relieves the symptoms. The treatment provided no relief.  Facial droop slurred speech left sided weakns     Prior to Admission medications   Medication Sig Start Date End Date Taking? Authorizing Provider  amLODipine (NORVASC) 10 MG tablet Take 10 mg by mouth daily. 07/24/19   [provider]  apixaban  (ELIQUIS ) 5 MG TABS tablet Take 5 mg by mouth 2 (two) times daily.    [provider]  Ascorbic Acid (VITAMIN C) 100 MG tablet Take by mouth. Patient not taking: Reported on 09/10/2022    [provider]  aspirin  EC 81 MG tablet Take by mouth. Patient not taking: Reported on 09/10/2022    [provider]  betamethasone, augmented, (DIPROLENE) 0.05 % gel betamethasone, augmented 0.05 % topical gel Patient not taking: Reported on 09/10/2022    [provider]  cefUROXime (CEFTIN) 500 MG tablet Take 500 mg by mouth 2 (two) times daily. Patient not taking: Reported on 09/10/2022 09/12/19   [provider]  Cholecalciferol (VITAMIN D -1000 MAX ST) 25 MCG (1000 UT) tablet Take by mouth. Patient not taking: Reported on 09/10/2022    [provider]  FLUoxetine  (PROZAC ) 20 MG capsule Take 20 mg by mouth daily. 11/05/20   [provider]  leflunomide (ARAVA) 20 MG tablet Take 20 mg by mouth daily. Patient not taking: Reported on 09/10/2022 07/24/19   [provider]  levofloxacin (LEVAQUIN) 500 MG tablet Take 500 mg by mouth daily. Patient not taking: Reported on 09/10/2022 09/14/19   [provider]  losartan-hydrochlorothiazide (HYZAAR) 100-25 MG tablet Take by mouth. Patient not taking: Reported on 09/10/2022 06/12/19   [provider]  metoprolol tartrate (LOPRESSOR) 50 MG tablet metoprolol tartrate 50 mg tablet    [provider]  naproxen sodium (ALEVE) 220 MG tablet Take by mouth. Patient not taking: Reported on 09/10/2022    [provider]  omeprazole (PRILOSEC) 40 MG capsule Take 40 mg by mouth daily. Patient not taking: Reported on 09/10/2022 08/13/19   [provider]  predniSONE  (DELTASONE ) 5 MG tablet Take 1 tablet (5 mg total) by mouth daily with breakfast. If tolerated for 7 days may increase dose to 10 mg po daily with breakfast 09/10/22   Kale, Gautam Kishore, MD    Allergies: Fenofibrate and Simvastatin    Review of Systems  Unable to perform ROS: Acuity of condition  Respiratory:  Negative for shortness of breath.   Cardiovascular:  Negative for chest pain.  Gastrointestinal:  Negative for abdominal pain.  Neurological:  Positive for speech difficulty. Negative for headaches.    Updated Vital Signs BP (!) 153/76 (BP Location: Right Arm)   Pulse 96   Temp (!) 97.5 F (36.4 C) (Oral)   Resp 20   Ht 6' 2 (1.88 m)  Wt 73.9 kg   SpO2 100%   BMI 20.92 kg/m   Physical Exam Vitals and nursing note reviewed. Exam conducted with a chaperone present.  Constitutional:      General: He is not in acute distress.    Appearance: He is well-developed. He is not diaphoretic.  HENT:     Head: Normocephalic and atraumatic.     Nose: Nose normal.     Mouth/Throat:     Mouth: Mucous membranes are moist.     Pharynx: Oropharynx is clear.  Eyes:      Conjunctiva/sclera: Conjunctivae normal.     Pupils: Pupils are equal, round, and reactive to light.  Cardiovascular:     Rate and Rhythm: Normal rate. Rhythm irregular.     Pulses: Normal pulses.     Heart sounds: Normal heart sounds.  Pulmonary:     Effort: Pulmonary effort is normal.     Breath sounds: Normal breath sounds. No wheezing or rales.  Abdominal:     General: Bowel sounds are normal.     Palpations: Abdomen is soft.     Tenderness: There is no abdominal tenderness. There is no guarding or rebound.  Musculoskeletal:        General: Normal range of motion.     Cervical back: Normal range of motion and neck supple.  Skin:    General: Skin is warm and dry.     Capillary Refill: Capillary refill takes less than 2 seconds.  Neurological:     Mental Status: He is alert and oriented to person, place, and time.     Cranial Nerves: Cranial nerve deficit present.  Psychiatric:        Mood and Affect: Mood normal.     (all labs ordered are listed, but only abnormal results are displayed) Results for orders placed or performed during the hospital encounter of 06/24/24  CBG monitoring, ED   Collection Time: 06/24/24  3:39 AM  Result Value Ref Range   Glucose-Capillary 140 (H) 70 - 99 mg/dL  Ethanol   Collection Time: 06/24/24  3:42 AM  Result Value Ref Range   Alcohol, Ethyl (B) <15 <15 mg/dL  Protime-INR   Collection Time: 06/24/24  3:42 AM  Result Value Ref Range   Prothrombin Time 17.1 (H) 11.4 - 15.2 seconds   INR 1.3 (H) 0.8 - 1.2  APTT   Collection Time: 06/24/24  3:42 AM  Result Value Ref Range   aPTT 31 24 - 36 seconds  CBC   Collection Time: 06/24/24  3:42 AM  Result Value Ref Range   WBC 10.0 4.0 - 10.5 K/uL   RBC 4.73 4.22 - 5.81 MIL/uL   Hemoglobin 14.2 13.0 - 17.0 g/dL   HCT 56.3 60.9 - 47.9 %   MCV 92.2 80.0 - 100.0 fL   MCH 30.0 26.0 - 34.0 pg   MCHC 32.6 30.0 - 36.0 g/dL   RDW 86.4 88.4 - 84.4 %   Platelets 96 (L) 150 - 400 K/uL   nRBC  0.0 0.0 - 0.2 %  Differential   Collection Time: 06/24/24  3:42 AM  Result Value Ref Range   Neutrophils Relative % 59 %   Neutro Abs 5.9 1.7 - 7.7 K/uL   Lymphocytes Relative 21 %   Lymphs Abs 2.1 0.7 - 4.0 K/uL   Monocytes Relative 9 %   Monocytes Absolute 0.9 0.1 - 1.0 K/uL   Eosinophils Relative 10 %   Eosinophils Absolute 1.0 (H) 0.0 - 0.5  K/uL   Basophils Relative 1 %   Basophils Absolute 0.1 0.0 - 0.1 K/uL   Immature Granulocytes 0 %   Abs Immature Granulocytes 0.04 0.00 - 0.07 K/uL  Comprehensive metabolic panel   Collection Time: 06/24/24  3:42 AM  Result Value Ref Range   Sodium 138 135 - 145 mmol/L   Potassium 3.3 (L) 3.5 - 5.1 mmol/L   Chloride 102 98 - 111 mmol/L   CO2 23 22 - 32 mmol/L   Glucose, Bld 136 (H) 70 - 99 mg/dL   BUN 15 8 - 23 mg/dL   Creatinine, Ser 8.89 0.61 - 1.24 mg/dL   Calcium  9.1 8.9 - 10.3 mg/dL   Total Protein 8.0 6.5 - 8.1 g/dL   Albumin 3.4 (L) 3.5 - 5.0 g/dL   AST 17 15 - 41 U/L   ALT 10 0 - 44 U/L   Alkaline Phosphatase 59 38 - 126 U/L   Total Bilirubin 0.4 0.0 - 1.2 mg/dL   GFR, Estimated >39 >39 mL/min   Anion gap 13 5 - 15  I-stat chem 8, ED   Collection Time: 06/24/24  3:47 AM  Result Value Ref Range   Sodium 140 135 - 145 mmol/L   Potassium 3.2 (L) 3.5 - 5.1 mmol/L   Chloride 104 98 - 111 mmol/L   BUN 18 8 - 23 mg/dL   Creatinine, Ser 8.99 0.61 - 1.24 mg/dL   Glucose, Bld 864 (H) 70 - 99 mg/dL   Calcium , Ion 1.09 (L) 1.15 - 1.40 mmol/L   TCO2 22 22 - 32 mmol/L   Hemoglobin 15.6 13.0 - 17.0 g/dL   HCT 53.9 60.9 - 47.9 %  Hemoglobin A1c   Collection Time: 06/24/24  6:42 AM  Result Value Ref Range   Hgb A1c MFr Bld 6.0 (H) 4.8 - 5.6 %   Mean Plasma Glucose 125.5 mg/dL  CBC   Collection Time: 06/24/24  6:42 AM  Result Value Ref Range   WBC 9.9 4.0 - 10.5 K/uL   RBC 4.63 4.22 - 5.81 MIL/uL   Hemoglobin 13.9 13.0 - 17.0 g/dL   HCT 57.1 60.9 - 47.9 %   MCV 92.4 80.0 - 100.0 fL   MCH 30.0 26.0 - 34.0 pg   MCHC 32.5 30.0  - 36.0 g/dL   RDW 86.3 88.4 - 84.4 %   Platelets 89 (L) 150 - 400 K/uL   nRBC 0.0 0.0 - 0.2 %  Comprehensive metabolic panel   Collection Time: 06/24/24  6:42 AM  Result Value Ref Range   Sodium 139 135 - 145 mmol/L   Potassium 3.8 3.5 - 5.1 mmol/L   Chloride 102 98 - 111 mmol/L   CO2 25 22 - 32 mmol/L   Glucose, Bld 149 (H) 70 - 99 mg/dL   BUN 14 8 - 23 mg/dL   Creatinine, Ser 8.92 0.61 - 1.24 mg/dL   Calcium  9.1 8.9 - 10.3 mg/dL   Total Protein 8.1 6.5 - 8.1 g/dL   Albumin 3.4 (L) 3.5 - 5.0 g/dL   AST 18 15 - 41 U/L   ALT 11 0 - 44 U/L   Alkaline Phosphatase 60 38 - 126 U/L   Total Bilirubin 0.6 0.0 - 1.2 mg/dL   GFR, Estimated >39 >39 mL/min   Anion gap 12 5 - 15   CT ANGIO HEAD NECK W WO CM (CODE STROKE) Result Date: 06/24/2024 EXAM: CTA HEAD AND NECK WITH AND WITHOUT 06/24/2024 03:51:49 AM TECHNIQUE: CTA of the  head and neck was performed with and without the administration of intravenous contrast. Multiplanar 2D and/or 3D reformatted images are provided for review. Automated exposure control, iterative reconstruction, and/or weight based adjustment of the mA/kV was utilized to reduce the radiation dose to as low as reasonably achievable. Stenosis of the internal carotid arteries measured using NASCET criteria. COMPARISON: None available CLINICAL HISTORY: Neuro deficit, acute, stroke suspected. Lt side weakness, lt facial droop, slurred speech FINDINGS: CTA NECK: AORTIC ARCH AND ARCH VESSELS: Calcific aortic atherosclerosis. No dissection or arterial injury. No significant stenosis of the brachiocephalic or subclavian arteries. CERVICAL CAROTID ARTERIES: Bilaterally there are areas of decreased opacification within the proximal internal carotid arteriesy which may be due to turbulent blood flow, though a non-occlusive atherosclerotic web could also have this appearance. The finding on the right is more convincing than that on the left. No hemodynamically significant stenosis of either  internal carotid artery. Bilateral tortuous distal internal carotid arteries. CERVICAL VERTEBRAL ARTERIES: No dissection, arterial injury, or significant stenosis. LUNGS AND MEDIASTINUM: Unremarkable. SOFT TISSUES: No acute abnormality. BONES: No acute abnormality. CTA HEAD: ANTERIOR CIRCULATION: Atherosclerotic calcification of the cavernous segments of both internal carotid arteries without hemodynamically significant stenosis. No significant stenosis of the internal carotid arteries. No significant stenosis of the anterior cerebral arteries. No significant stenosis of the middle cerebral arteries. No aneurysm. POSTERIOR CIRCULATION: No significant stenosis of the posterior cerebral arteries. No significant stenosis of the basilar artery. No significant stenosis of the vertebral arteries. No aneurysm. OTHER: No dural venous sinus thrombosis on this non-dedicated study. IMPRESSION: 1. No large vessel occlusion, hemodynamically significant stenosis, or aneurysm in the head or neck. 2. Focal areas of decreased contrast enhancement in both proximal internal carotid arteries may be secondary to turbulent flow, though non-occlusive atherosclerotic webs or partially adherent thrombus may also have this appearance. The finding on the right is more convincing. 3. Case discussed with Dr. Vanessa at 4:00 am on 06/24/24. Electronically signed by: Franky Stanford MD 06/24/2024 04:05 AM EDT RP Workstation: [id number]   CT HEAD CODE STROKE WO CONTRAST Result Date: 06/24/2024 EXAM: CT HEAD WITHOUT CONTRAST 06/24/2024 03:45:22 AM TECHNIQUE: CT of the head was performed without the administration of intravenous contrast. Automated exposure control, iterative reconstruction, and/or weight based adjustment of the mA/kV was utilized to reduce the radiation dose to as low as reasonably achievable. COMPARISON: 03/30/2014 CLINICAL HISTORY: Neuro deficit, acute, stroke suspected. Stroke, Lt side facial droop, slurred speech; Dr. Aron  FINDINGS: BRAIN AND VENTRICLES: No acute hemorrhage. Gray-white differentiation is preserved. No hydrocephalus. No extra-axial collection. No mass effect or midline shift. Chronic ischemic white matter changes. 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 ASPECTS is 10. ORBITS: No acute abnormality. SINUSES: No acute abnormality. SOFT TISSUES AND SKULL: No acute soft tissue abnormality. No skull fracture. IMPRESSION: 1. No acute intracranial abnormality. ASPECTS is 10. 2. 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 3. Findings communicated to Dr. Sal Khaliqdina at 3:49 am on 06/24/24. Electronically signed by: Franky Stanford MD 06/24/2024 03:50 AM EDT RP Workstation: [id number]     Radiology: CT ANGIO HEAD NECK W WO CM (CODE STROKE) Result Date: 06/24/2024 EXAM: CTA HEAD AND NECK WITH AND WITHOUT 06/24/2024 03:51:49 AM TECHNIQUE: CTA of the head and neck was performed with and without the administration of intravenous contrast. Multiplanar 2D and/or 3D reformatted images are provided for review. Automated exposure control, iterative reconstruction, and/or weight based adjustment of the mA/kV  was utilized to reduce the radiation dose to as low as reasonably achievable. Stenosis of the internal carotid arteries measured using NASCET criteria. COMPARISON: None available CLINICAL HISTORY: Neuro deficit, acute, stroke suspected. Lt side weakness, lt facial droop, slurred speech FINDINGS: CTA NECK: AORTIC ARCH AND ARCH VESSELS: Calcific aortic atherosclerosis. No dissection or arterial injury. No significant stenosis of the brachiocephalic or subclavian arteries. CERVICAL CAROTID ARTERIES: Bilaterally there are areas of decreased opacification within the proximal internal carotid arteriesy which may be due to turbulent blood flow, though a non-occlusive atherosclerotic web could also have this appearance. The finding on the right is more convincing than  that on the left. No hemodynamically significant stenosis of either internal carotid artery. Bilateral tortuous distal internal carotid arteries. CERVICAL VERTEBRAL ARTERIES: No dissection, arterial injury, or significant stenosis. LUNGS AND MEDIASTINUM: Unremarkable. SOFT TISSUES: No acute abnormality. BONES: No acute abnormality. CTA HEAD: ANTERIOR CIRCULATION: Atherosclerotic calcification of the cavernous segments of both internal carotid arteries without hemodynamically significant stenosis. No significant stenosis of the internal carotid arteries. No significant stenosis of the anterior cerebral arteries. No significant stenosis of the middle cerebral arteries. No aneurysm. POSTERIOR CIRCULATION: No significant stenosis of the posterior cerebral arteries. No significant stenosis of the basilar artery. No significant stenosis of the vertebral arteries. No aneurysm. OTHER: No dural venous sinus thrombosis on this non-dedicated study. IMPRESSION: 1. No large vessel occlusion, hemodynamically significant stenosis, or aneurysm in the head or neck. 2. Focal areas of decreased contrast enhancement in both proximal internal carotid arteries may be secondary to turbulent flow, though non-occlusive atherosclerotic webs or partially adherent thrombus may also have this appearance. The finding on the right is more convincing. 3. Case discussed with Dr. Vanessa at 4:00 am on 06/24/24. Electronically signed by: Franky Stanford MD 06/24/2024 04:05 AM EDT RP Workstation: [id number]   CT HEAD CODE STROKE WO CONTRAST Result Date: 06/24/2024 EXAM: CT HEAD WITHOUT CONTRAST 06/24/2024 03:45:22 AM TECHNIQUE: CT of the head was performed without the administration of intravenous contrast. Automated exposure control, iterative reconstruction, and/or weight based adjustment of the mA/kV was utilized to reduce the radiation dose to as low as reasonably achievable. COMPARISON: 03/30/2014 CLINICAL HISTORY: Neuro deficit, acute, stroke  suspected. Stroke, Lt side facial droop, slurred speech; Dr. Aron FINDINGS: BRAIN AND VENTRICLES: No acute hemorrhage. Gray-white differentiation is preserved. No hydrocephalus. No extra-axial collection. No mass effect or midline shift. Chronic ischemic white matter changes. 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 ASPECTS is 10. ORBITS: No acute abnormality. SINUSES: No acute abnormality. SOFT TISSUES AND SKULL: No acute soft tissue abnormality. No skull fracture. IMPRESSION: 1. No acute intracranial abnormality. ASPECTS is 10. 2. 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 3. Findings communicated to Dr. Sal Khaliqdina at 3:49 am on 06/24/24. Electronically signed by: Franky Stanford MD 06/24/2024 03:50 AM EDT RP Workstation: [id number]     Procedures   Medications Ordered in the ED  potassium chloride  10 mEq in 100 mL IVPB (10 mEq Intravenous New Bag/Given 06/24/24 0434)  FLUoxetine  (PROZAC ) capsule 20 mg (has no administration in time range)   stroke: early stages of recovery book (has no administration in time range)  0.9 %  sodium chloride  infusion (has no administration in time range)  acetaminophen  (TYLENOL ) suppository 650 mg (has no administration in time range)  iohexol  (OMNIPAQUE ) 350 MG/ML injection 75 mL (75 mLs Intravenous Contrast Given 06/24/24 0352)  Medical Decision Making Amount and/or Complexity of Data Reviewed External Data Reviewed: notes.    Details: Previous notes  Labs: ordered.    Details: Normal sodium 138, potassium slight low, normal creatinine, negative ETOH, normal white count 10, normal hemoglobin 14.2, normal platelets.   Radiology: ordered and independent interpretation performed.    Details: No ICH  Risk Prescription drug management. Decision regarding hospitalization.     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

## 2024-06-24 NOTE — Hospital Course (Addendum)
 Jackson Lawson

## 2024-06-25 ENCOUNTER — Inpatient Hospital Stay (HOSPITAL_COMMUNITY)

## 2024-06-25 DIAGNOSIS — I6350 Cerebral infarction due to unspecified occlusion or stenosis of unspecified cerebral artery: Secondary | ICD-10-CM | POA: Diagnosis not present

## 2024-06-25 DIAGNOSIS — I6389 Other cerebral infarction: Secondary | ICD-10-CM

## 2024-06-25 DIAGNOSIS — Z8739 Personal history of other diseases of the musculoskeletal system and connective tissue: Secondary | ICD-10-CM | POA: Diagnosis not present

## 2024-06-25 DIAGNOSIS — R29818 Other symptoms and signs involving the nervous system: Secondary | ICD-10-CM | POA: Diagnosis not present

## 2024-06-25 LAB — CBC WITH DIFFERENTIAL/PLATELET
Abs Granulocyte: 5.4 K/uL (ref 1.5–6.5)
Abs Immature Granulocytes: 0.02 K/uL (ref 0.00–0.07)
Basophils Absolute: 0 K/uL (ref 0.0–0.1)
Basophils Relative: 0 %
Eosinophils Absolute: 0.4 K/uL (ref 0.0–0.5)
Eosinophils Relative: 5 %
HCT: 39.1 % (ref 39.0–52.0)
Hemoglobin: 12.9 g/dL — ABNORMAL LOW (ref 13.0–17.0)
Immature Granulocytes: 0 %
Lymphocytes Relative: 18 %
Lymphs Abs: 1.4 K/uL (ref 0.7–4.0)
MCH: 30 pg (ref 26.0–34.0)
MCHC: 33 g/dL (ref 30.0–36.0)
MCV: 90.9 fL (ref 80.0–100.0)
Monocytes Absolute: 0.7 K/uL (ref 0.1–1.0)
Monocytes Relative: 9 %
Neutro Abs: 5.4 K/uL (ref 1.7–7.7)
Neutrophils Relative %: 68 %
Platelets: 88 K/uL — ABNORMAL LOW (ref 150–400)
RBC: 4.3 MIL/uL (ref 4.22–5.81)
RDW: 13.9 % (ref 11.5–15.5)
WBC: 8 K/uL (ref 4.0–10.5)
nRBC: 0 % (ref 0.0–0.2)

## 2024-06-25 LAB — BASIC METABOLIC PANEL WITH GFR
Anion gap: 5 (ref 5–15)
BUN: 12 mg/dL (ref 8–23)
CO2: 25 mmol/L (ref 22–32)
Calcium: 8.6 mg/dL — ABNORMAL LOW (ref 8.9–10.3)
Chloride: 108 mmol/L (ref 98–111)
Creatinine, Ser: 1.08 mg/dL (ref 0.61–1.24)
GFR, Estimated: >60 mL/min (ref >60)
Glucose, Bld: 119 mg/dL — ABNORMAL HIGH (ref 70–99)
Potassium: 3.3 mmol/L — ABNORMAL LOW (ref 3.5–5.1)
Sodium: 138 mmol/L (ref 135–145)

## 2024-06-25 LAB — ECHOCARDIOGRAM COMPLETE
Height: 74 in
S' Lateral: 3.2 cm
Weight: 2606.72 [oz_av]

## 2024-06-25 MED ORDER — POTASSIUM CHLORIDE CRYS ER 20 MEQ PO TBCR
40.0000 meq | EXTENDED_RELEASE_TABLET | Freq: Once | ORAL | Status: AC
  Administered 2024-06-25: 40 meq via ORAL
  Filled 2024-06-25: qty 2

## 2024-06-25 MED ORDER — POTASSIUM CHLORIDE 20 MEQ PO PACK: 40.0000 meq | PACK | Freq: Once | ORAL | Status: DC

## 2024-06-25 MED ORDER — NITROGLYCERIN 0.4 MG SL SUBL: 0.4000 mg | SUBLINGUAL_TABLET | SUBLINGUAL | Status: DC | PRN

## 2024-06-25 MED ORDER — PANTOPRAZOLE SODIUM 40 MG PO TBEC
40.0000 mg | DELAYED_RELEASE_TABLET | Freq: Once | ORAL | Status: AC
  Administered 2024-06-25: 40 mg via ORAL
  Filled 2024-06-25: qty 1

## 2024-06-25 MED ORDER — ROSUVASTATIN CALCIUM 5 MG PO TABS
10.0000 mg | ORAL_TABLET | Freq: Every day | ORAL | Status: DC
  Administered 2024-06-25 – 2024-06-27 (×3): 10 mg via ORAL
  Filled 2024-06-25 (×3): qty 2

## 2024-06-25 NOTE — Progress Notes (Signed)
 Physical Medicine and Rehabilitation Consult Reason for Consult: Left-sided weakness Referring Physician: Jerri   HPI: Jackson Lawson is a 83 y.o. male with a history of CAD, hypertension, A-fib on Eliquis , rheumatoid arthritis who presented on 06/24/2024 with left-sided weakness.  CT of the head did not show any acute changes although chronic ischemic white matter changes and multiple small vessel infarcts were seen.  CTA revealed no large vessel occlusion although focal areas of decreased contrast enhancement in both proximal ICAs secondary to turbulent flow versus nonocclusive atherosclerotic webs versus partially adherent thrombus more prominent on the right than the left.  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  Neurology feels that strokes were due to small vessel disease.  Patient was on Eliquis  prior to admission.  Now she is on aspirin  and Eliquis .  Patient is on the D2 thin liquid diet for dysphagia and followed by speech language pathology.  Patient was up with therapy yesterday and was mod assist for sit to stand transfers but did not standing or gait just yet due to leg buckling.  Patient was total to mod assist with basic ADLs.  Jackson Lawson lives with his spouse in a 1 level house with one-step to enter.  At home prior to the stroke he was sleeping in his recliner quite a bit and ambulating using a single-point cane.  He had fallen a few times over the last several months.  Wife manages IADLs.  Patient was modified independent with basic mobility and self-care.    Home: Home Living Family/patient expects to be discharged to:: Private residence Living Arrangements: Spouse/significant other Available Help at Discharge: Family, Available 24 hours/day Type of Home: House Home Access: Stairs to enter [employer] of Steps: 1  (threshold) Entrance Stairs-Rails: None Home Layout: One level Alternate Level Stairs-Number of Steps: 2 (down to the bedroom and into the kitchen) Alternate Level Stairs-Rails: Left Bathroom Shower/Tub: Engineer, manufacturing systems: Standard Home Equipment: Medical laboratory scientific officer - single point, Information systems manager  Lives With: Spouse  Functional History: Prior Function Prior Level of Function : Independent/Modified Independent, History of Falls (last six months) Mobility Comments: He sleeps in a recliner chair a lot. Ambulates using SPC. Pt reports he forgets it sometimes. Several falls in the past 6 mo, estimating 1 a month, but wife reports he hasn't fallen recently. ADLs Comments: Indep with ADLs. Wife manages IADLs including houshold chores, cooking, and managing medications. Pt reports he drives very little. Wife states they just don't go anywhere. Functional Status:  Mobility: Bed Mobility Overal bed mobility: Needs Assistance Bed Mobility: Supine to Sit, Sit to Supine Supine to sit: Mod assist Sit to supine: Mod assist, +2 for safety/equipment General bed mobility comments: Pt sat up on R side of stretcher with increased time. Cues for sequencing. Assist to bring LLE off EOB and elevate trunk. Returning to bed pt controlled trunk, assist to bring BLE back into bed, and manage lines/leads. Transfers Overall transfer level: Needs assistance Equipment used: 2 person hand held assist Transfers: Sit to/from Stand Sit to Stand: Mod assist, +2 physical assistance, +2 safety/equipment, Max assist General transfer comment: assist to power up; once hips cleared from EOB, pt attempts to lift LLE up off floor but denies pain but poor tolerance of weight bearing and RLE buckling needing knee block. second attempt with block to bil sides of  L knee, greater tolerance of weightbearing, but continues to require modA      ADL: ADL Overall ADL's : Needs assistance/impaired Eating/Feeding: Set up, Bed  level Grooming: Set up, Bed level Upper Body Bathing: Moderate assistance, Sitting Lower Body Bathing: Sitting/lateral leans, Sit to/from stand, +2 for safety/equipment, Maximal assistance Upper Body Dressing : Moderate assistance, Sitting Lower Body Dressing: Total assistance Lower Body Dressing Details (indicate cue type and reason): for socks Toilet Transfer: Moderate assistance, +2 for physical assistance, +2 for safety/equipment Toilet Transfer Details (indicate cue type and reason): sts only Functional mobility during ADLs: Moderate assistance, +2 for physical assistance, +2 for safety/equipment General ADL Comments: STS only  Cognition: Cognition Overall Cognitive Status: Impaired/Different from baseline Arousal/Alertness: Awake/alert Orientation Level: Oriented X4 Attention: Sustained Sustained Attention: Appears intact Awareness: Impaired Awareness Impairment: Other (comment) (online) Safety/Judgment: Impaired Comments: flat affect Cognition Arousal: Alert Behavior During Therapy: Flat affect Overall Cognitive Status: Impaired/Different from baseline   Review of Systems  Constitutional: Negative.   HENT: Negative.    Eyes:  Positive for blurred vision.  Respiratory: Negative.    Cardiovascular: Negative.   Gastrointestinal: Negative.   Genitourinary: Negative.   Musculoskeletal:  Positive for joint pain and neck pain.  Skin: Negative.   Neurological:  Positive for speech change and focal weakness.  Psychiatric/Behavioral: Negative.     History reviewed. No pertinent past medical history. History reviewed. No pertinent surgical history. History reviewed. No pertinent family history. Social History:  reports that he has never smoked. He has never used smokeless tobacco. No history on file for alcohol use and drug use. Allergies:  Allergies  Allergen Reactions   Fenofibrate Diarrhea   Simvastatin Diarrhea   Medications Prior to Admission  Medication Sig  Dispense Refill   acetaminophen  (TYLENOL ) 500 MG tablet Take 1,000 mg by mouth 2 (two) times daily as needed for moderate pain (pain score 4-6).     amLODipine (NORVASC) 10 MG tablet Take 10 mg by mouth daily.     apixaban  (ELIQUIS ) 5 MG TABS tablet Take 5 mg by mouth 2 (two) times daily.     FLUoxetine  (PROZAC ) 20 MG capsule Take 20 mg by mouth daily.     losartan-hydrochlorothiazide (HYZAAR) 100-25 MG tablet Take 1 tablet by mouth daily.     Multiple Vitamin (MULTI-VITAMIN) tablet Take 1 tablet by mouth daily.     omeprazole (PRILOSEC) 40 MG capsule Take 40 mg by mouth daily.     nitroGLYCERIN  (NITROSTAT ) 0.4 MG SL tablet Place 0.4 mg under the tongue every 5 (five) minutes as needed for chest pain. (Patient not taking: Reported on 06/24/2024)       Blood pressure (!) 180/102, pulse 70, temperature 97.9 F (36.6 C), temperature source Oral, resp. rate 20, height 6' 2 (1.88 m), weight 73.9 kg, SpO2 95%. Physical Exam Constitutional:      General: He is not in acute distress.    Appearance: He is ill-appearing.  HENT:     Right Ear: External ear normal.     Left Ear: External ear normal.     Nose: Nose normal.     Mouth/Throat:     Mouth: Mucous membranes are moist.  Eyes:     Pupils: Pupils are equal, round, and reactive to light.  Cardiovascular:     Rate and Rhythm: Normal rate.  Pulmonary:     Effort: Pulmonary effort is normal.  Abdominal:     Palpations: Abdomen is soft.  Musculoskeletal:     Comments: Severe  RA deformities in both hands with fixed, flexed MCP's. Able to grasp spoon with left hand.   Skin:    General: Skin is warm.     Findings: Bruising present.  Neurological:     Comments: Pt with dysarthric speech. Language normal. Left central VII and tongue deviation. Fair insight and awareness. Memory seems to be intact. MMT: LUE 3- to 3/5 prox to distal with hand limited by RA. RUE is 4 to 4+/5 prox to distal with hand again limited. LLE 3/5 HF, KE and 3+ to  4-ADF/PF. RLE 4 to 4+/5. Sensory exam normal for light touch and pain in all 4 limbs. No limb ataxia or cerebellar signs. No abnormal tone appreciated.    Psychiatric:     Comments: Pt flat, fairly approachable though.      Results for orders placed or performed during the hospital encounter of 06/24/24 (from the past 24 hours)  Basic metabolic panel with GFR     Status: Abnormal   Collection Time: 06/25/24  4:36 AM  Result Value Ref Range   Sodium 138 135 - 145 mmol/L   Potassium 3.3 (L) 3.5 - 5.1 mmol/L   Chloride 108 98 - 111 mmol/L   CO2 25 22 - 32 mmol/L   Glucose, Bld 119 (H) 70 - 99 mg/dL   BUN 12 8 - 23 mg/dL   Creatinine, Ser 8.91 0.61 - 1.24 mg/dL   Calcium  8.6 (L) 8.9 - 10.3 mg/dL   GFR, Estimated >39 >39 mL/min   Anion gap 5 5 - 15  CBC with Differential/Platelet     Status: Abnormal   Collection Time: 06/25/24  4:36 AM  Result Value Ref Range   WBC 8.0 4.0 - 10.5 K/uL   RBC 4.30 4.22 - 5.81 MIL/uL   Hemoglobin 12.9 (L) 13.0 - 17.0 g/dL   HCT 60.8 60.9 - 47.9 %   MCV 90.9 80.0 - 100.0 fL   MCH 30.0 26.0 - 34.0 pg   MCHC 33.0 30.0 - 36.0 g/dL   RDW 86.0 88.4 - 84.4 %   Platelets 88 (L) 150 - 400 K/uL   nRBC 0.0 0.0 - 0.2 %   Neutrophils Relative % 68 %   Neutro Abs 5.4 1.7 - 7.7 K/uL   Lymphocytes Relative 18 %   Lymphs Abs 1.4 0.7 - 4.0 K/uL   Monocytes Relative 9 %   Monocytes Absolute 0.7 0.1 - 1.0 K/uL   Eosinophils Relative 5 %   Eosinophils Absolute 0.4 0.0 - 0.5 K/uL   Basophils Relative 0 %   Basophils Absolute 0.0 0.0 - 0.1 K/uL   Immature Granulocytes 0 %   Abs Immature Granulocytes 0.02 0.00 - 0.07 K/uL   Abs Granulocyte 5.4 1.5 - 6.5 K/uL   DG Swallowing Func-Speech Pathology Result Date: 06/24/2024 Modified Barium Swallow Study Patient Details Name: ARYAMAN HALIBURTON MRN: [id number] Date of Birth: [birth year] Today's Date: 06/24/2024 HPI/PMH: HPI: 83 yo male presenting 8/17 with L sided weakness and slurred speech. MRI showed an acute/subacute R  paramedian pontine infarct. PMH includes: CAD, HTN, afib, RA. MRI also showed remote infarcts in the thalami (R>L), L caudate head, and L corona radiata. Esophagram in 2006 indicating reflux and hiatal hernia. Clinical Impression: Clinical Impression: Pt has an oropharyngeal dysphagia marked by impaired timing, weakness, and reduced sensation. Labial seal is reduced and lingual propulsion is slow, leading to small amounts of anterior loss as well as oral residue that sometimes spills back toward the pharynx. Thin and  nectar thick liquids spill to the pyrifrom sinuses before the swallow and because they do, they are able to spill into the airway because he also has reduced hyolaryngeal movement and laryngeal vestibule closure. Airway protection is improved when boluses are better contained above the valleculae before the swallow begins, which happens with honey thick liquids and solids. Attempted a chin tuck posture which can better contain nectar thick liquids, although not when boluses start to increase in size. Sensation of aspiration is volume dependent (mostly PAS 8; one instance of PAS 7 with thin liquids with chin tuck given larger volume). Recommend starting with Dys 2 (finely chopped) diet and honey thick liquids with ongoing SLP f/u warranted. DIGEST Swallow Severity Rating*             Safety: 3             Efficiency:0             Overall Pharyngeal Swallow Severity: 3 1: mild; 2: moderate; 3: severe; 4: profound *The Dynamic Imaging Grade of Swallowing Toxicity is standardized for the head and neck cancer population, however, demonstrates promising clinical applications across populations to standardize the clinical rating of pharyngeal swallow safety and severity.  Factors that may increase risk of adverse event in presence of aspiration Noe & Lianne 2021): Factors that may increase risk of adverse event in presence of aspiration Noe & Lianne 2021): Limited mobility; Frail or deconditioned;  Dependence for feeding and/or oral hygiene; Weak cough; Aspiration of thick, dense, and/or acidic materials Recommendations/Plan: Swallowing Evaluation Recommendations Swallowing Evaluation Recommendations Recommendations: PO diet PO Diet Recommendation: Dysphagia 2 (Finely chopped); Moderately thick liquids (Level 3, honey thick) Liquid Administration via: Cup; Straw Medication Administration: Whole meds with puree Supervision: Staff to assist with self-feeding; Full supervision/cueing for swallowing strategies Swallowing strategies  : Slow rate; Small bites/sips; Check for pocketing or oral holding; Check for anterior loss Postural changes: Position pt fully upright for meals; Stay upright 30-60 min after meals Oral care recommendations: Oral care BID (2x/day) Treatment Plan Treatment Plan Treatment recommendations: Therapy as outlined in treatment plan below Follow-up recommendations: Acute inpatient rehab (3 hours/day) Functional status assessment: Patient has had a recent decline in their functional status and demonstrates the ability to make significant improvements in function in a reasonable and predictable amount of time. Treatment frequency: Min 2x/week Treatment duration: 2 weeks Interventions: Aspiration precaution training; Compensatory techniques; Patient/family education; Trials of upgraded texture/liquids; Diet toleration management by SLP Recommendations Recommendations for follow up therapy are one component of a multi-disciplinary discharge planning process, led by the attending physician.  Recommendations may be updated based on patient status, additional functional criteria and insurance authorization. Assessment: Orofacial Exam: Orofacial Exam Oral Cavity - Dentition: Adequate natural dentition; Missing dentition Orofacial Anatomy: WFL Oral Motor/Sensory Function: Suspected cranial nerve impairment CN VII - Facial: Left motor impairment Anatomy: Anatomy: WFL Boluses Administered: Boluses  Administered Boluses Administered: Thin liquids (Level 0); Mildly thick liquids (Level 2, nectar thick); Moderately thick liquids (Level 3, honey thick); Puree; Solid  Oral Impairment Domain: Oral Impairment Domain Lip Closure: Escape progressing to mid-chin Tongue control during bolus hold: Escape to lateral buccal cavity/floor of mouth Bolus preparation/mastication: Slow prolonged chewing/mashing with complete recollection Bolus transport/lingual motion: Slow tongue motion Oral residue: Residue collection on oral structures Location of oral residue : Tongue (spills back to pharynx) Initiation of pharyngeal swallow : Pyriform sinuses  Pharyngeal Impairment Domain: Pharyngeal Impairment Domain Soft palate elevation: No bolus between soft palate (SP)/pharyngeal wall (  PW) Laryngeal elevation: Partial superior movement of thyroid  cartilage/partial approximation of arytenoids to epiglottic petiole Anterior hyoid excursion: Partial anterior movement Epiglottic movement: Partial inversion Laryngeal vestibule closure: Incomplete, narrow column air/contrast in laryngeal vestibule Pharyngeal stripping wave : Present - complete Pharyngeal contraction (A/P view only): N/A Pharyngoesophageal segment opening: Complete distension and complete duration, no obstruction of flow Tongue base retraction: No contrast between tongue base and posterior pharyngeal wall (PPW) Pharyngeal residue: Trace residue within or on pharyngeal structures Location of pharyngeal residue: Pyriform sinuses  Esophageal Impairment Domain: Esophageal Impairment Domain Esophageal clearance upright position: Esophageal retention Pill: Pill Consistency administered: Mildly thick liquids (Level 2, nectar thick) Mildly thick liquids (Level 2, nectar thick): Impaired (see clinical impressions) Penetration/Aspiration Scale Score: Penetration/Aspiration Scale Score 1.  Material does not enter airway: Puree; Solid; Pill 8.  Material enters airway, passes BELOW cords  without attempt by patient to eject out (silent aspiration) : Thin liquids (Level 0); Mildly thick liquids (Level 2, nectar thick) Compensatory Strategies: Compensatory Strategies Compensatory strategies: Yes Straw: Ineffective Ineffective Straw: Thin liquid (Level 0); Mildly thick liquid (Level 2, nectar thick) Chin tuck: Ineffective Ineffective Chin Tuck: Thin liquid (Level 0); Mildly thick liquid (Level 2, nectar thick)   General Information: Caregiver present: No  Diet Prior to this Study: NPO   Temperature : Normal   Respiratory Status: WFL   Supplemental O2: None (Room air)   History of Recent Intubation: No  Behavior/Cognition: Alert; Cooperative; Pleasant mood Self-Feeding Abilities: Dependent for feeding Baseline vocal quality/speech: Normal Volitional Cough: Able to elicit Volitional Swallow: Able to elicit Exam Limitations: No limitations Goal Planning: Prognosis for improved oropharyngeal function: Good No data recorded No data recorded Patient/Family Stated Goal: none stated Consulted and agree with results and recommendations: Patient Pain: Pain Assessment Pain Assessment: Faces Faces Pain Scale: 0 Pain Location: generalized Pain Descriptors / Indicators: Discomfort Pain Intervention(s): Monitored during session End of Session: Start Time:SLP Start Time (ACUTE ONLY): 1140 Stop Time: SLP Stop Time (ACUTE ONLY): 1157 Time Calculation:SLP Time Calculation (min) (ACUTE ONLY): 17 min Charges: SLP Evaluations $ SLP Speech Visit: 1 Visit SLP Evaluations $BSS Swallow: 1 Procedure $MBS Swallow: 1 Procedure $ SLP EVAL LANGUAGE/SOUND PRODUCTION: 1 Procedure SLP visit diagnosis: SLP Visit Diagnosis: Dysphagia, oropharyngeal phase (R13.12) Past Medical History: No past medical history on file. Past Surgical History: No past surgical history on file. Leita SQUIBB Nix 06/24/2024, 1:34 PM  MR ANGIO HEAD WO CONTRAST Result Date: 06/24/2024 EXAM: MR Angiography Head without intravenous Contrast. 06/24/2024 07:03:00 AM  TECHNIQUE: Magnetic resonance angiography images of the head without intravenous contrast. Multiplanar 2D and 3D reformatted images are provided for review. 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 Abnormal CT angiography of the neck. SABRA FINDINGS: ANTERIOR CIRCULATION: The left A1 segment is dominant. Mild atherosclerotic changes are present within the cavernous internal carotid arteries bilaterally without significant stenoses. No aneurysm. POSTERIOR CIRCULATION: No significant stenosis of the posterior cerebral arteries. No significant stenosis of the basilar artery. No significant stenosis of the vertebral arteries. No aneurysm. IMPRESSION: 1. No significant stenosis of the intracranial vasculature. 2. Mild atherosclerotic changes within the cavernous internal carotid arteries bilaterally. Electronically signed by: Lonni Necessary MD 06/24/2024 08:23 AM EDT RP Workstation: [id number]   MR ANGIO NECK W WO CONTRAST Result Date: 06/24/2024 EXAM: MRA Neck without and with contrast 06/24/2024 07:05:00 AM TECHNIQUE: Multiplanar multisequence MRA of the neck was performed without and with the administration of  7 mL intravenous gadobutrol  (GADAVIST ) 1 MMOL/ML. 2D and 3D reformatted images are provided for review. Stenosis of the internal carotid arteries is measured using NASCET criteria. 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 FINDINGS: CAROTID ARTERIES: Noncontrast time-of-flight images demonstrate signal loss in the proximal internal and external carotid arteries bilaterally with significant motion artifact at this level. Post contrast images demonstrate no significant stenosis or web. Some venous contamination is present. Moderate tortuosity is present in the mid cervical ICA bilaterally without significant  stenosis. VERTEBRAL ARTERIES: Flow is antegrade in the vertebral arteries bilaterally. No focal stenosis is present. IMPRESSION: 1. No hemodynamically significant stenosis or dissection of the neck arteries. 2. Moderate tortuosity in the mid cervical ICA bilaterally without significant stenosis. Electronically signed by: Lonni Necessary MD 06/24/2024 08:21 AM EDT RP Workstation: [id number]   MR BRAIN WO CONTRAST Result Date: 06/24/2024 EXAM: MRI BRAIN WITHOUT CONTRAST 06/24/2024 07:03:47 AM TECHNIQUE: Multiplanar multisequence MRI of the head/brain was performed without the administration of intravenous contrast. COMPARISON: None available. CLINICAL HISTORY: Neuro deficit, acute, stroke suspected. 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 No acute supratentorial infarct is present. Mild T2 signal changes are associated with the acute infarct. Remote infarcts are present in the thalami, right greater than left. A remote lacunar infarct is present in the left caudate head. Remote lacunar infarcts are also present in the left corona radiata. Moderate atrophy and white matter disease are present. Dilated perivascular spaces are present throughout the basal ganglia bilaterally. Scattered foci of susceptibility are present throughout the basal ganglia and thalami bilaterally. Other scattered foci are present in the parietal greater than frontal lobes. A focal area of susceptibility in the medial left temporal lobe measures 12 mm. No mass. No midline shift. No hydrocephalus. The sella is unremarkable. Normal flow voids. ORBITS: Bilateral lens replacements are noted. The globes and orbits are otherwise within normal limits. SINUSES AND MASTOIDS: No acute abnormality. BONES AND SOFT TISSUES: Normal marrow signal. No acute soft tissue abnormality. IMPRESSION: 1. Acute/subacute nonhemorrhagic right paramedian pontine infarct measuring 8 x 13 mm  with associated mild T2 signal changes. No acute supratentorial infarct. 2. Remote infarcts in the thalami (right greater than left), left caudate head, and left corona radiata. 3. Moderate atrophy and white matter disease. Electronically signed by: Lonni Necessary MD 06/24/2024 08:18 AM EDT RP Workstation: [id number]   CT ANGIO HEAD NECK W WO CM (CODE STROKE) Result Date: 06/24/2024 EXAM: CTA HEAD AND NECK WITH AND WITHOUT 06/24/2024 03:51:49 AM TECHNIQUE: CTA of the head and neck was performed with and without the administration of intravenous contrast. Multiplanar 2D and/or 3D reformatted images are provided for review. Automated exposure control, iterative reconstruction, and/or weight based adjustment of the mA/kV was utilized to reduce the radiation dose to as low as reasonably achievable. Stenosis of the internal carotid arteries measured using NASCET criteria. COMPARISON: None available CLINICAL HISTORY: Neuro deficit, acute, stroke suspected. Lt side weakness, lt facial droop, slurred speech FINDINGS: CTA NECK: AORTIC ARCH AND ARCH VESSELS: Calcific aortic atherosclerosis. No dissection or arterial injury. No significant stenosis of the brachiocephalic or subclavian arteries. CERVICAL CAROTID ARTERIES: Bilaterally there are areas of decreased opacification within the proximal internal carotid arteriesy which may be due to turbulent blood flow, though a non-occlusive atherosclerotic web could also have this appearance. The finding on the right is more convincing than that on the  left. No hemodynamically significant stenosis of either internal carotid artery. Bilateral tortuous distal internal carotid arteries. CERVICAL VERTEBRAL ARTERIES: No dissection, arterial injury, or significant stenosis. LUNGS AND MEDIASTINUM: Unremarkable. SOFT TISSUES: No acute abnormality. BONES: No acute abnormality. CTA HEAD: ANTERIOR CIRCULATION: Atherosclerotic calcification of the cavernous segments of both internal  carotid arteries without hemodynamically significant stenosis. No significant stenosis of the internal carotid arteries. No significant stenosis of the anterior cerebral arteries. No significant stenosis of the middle cerebral arteries. No aneurysm. POSTERIOR CIRCULATION: No significant stenosis of the posterior cerebral arteries. No significant stenosis of the basilar artery. No significant stenosis of the vertebral arteries. No aneurysm. OTHER: No dural venous sinus thrombosis on this non-dedicated study. IMPRESSION: 1. No large vessel occlusion, hemodynamically significant stenosis, or aneurysm in the head or neck. 2. Focal areas of decreased contrast enhancement in both proximal internal carotid arteries may be secondary to turbulent flow, though non-occlusive atherosclerotic webs or partially adherent thrombus may also have this appearance. The finding on the right is more convincing. 3. Case discussed with Dr. Vanessa at 4:00 am on 06/24/24. Electronically signed by: Franky Stanford MD 06/24/2024 04:05 AM EDT RP Workstation: [id number]   CT HEAD CODE STROKE WO CONTRAST Result Date: 06/24/2024 EXAM: CT HEAD WITHOUT CONTRAST 06/24/2024 03:45:22 AM TECHNIQUE: CT of the head was performed without the administration of intravenous contrast. Automated exposure control, iterative reconstruction, and/or weight based adjustment of the mA/kV was utilized to reduce the radiation dose to as low as reasonably achievable. COMPARISON: 03/30/2014 CLINICAL HISTORY: Neuro deficit, acute, stroke suspected. Stroke, Lt side facial droop, slurred speech; Dr. Aron FINDINGS: BRAIN AND VENTRICLES: No acute hemorrhage. Gray-white differentiation is preserved. No hydrocephalus. No extra-axial collection. No mass effect or midline shift. Chronic ischemic white matter changes. 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 ASPECTS is 10. ORBITS: No acute abnormality. SINUSES: No acute abnormality. SOFT TISSUES AND SKULL: No  acute soft tissue abnormality. No skull fracture. IMPRESSION: 1. No acute intracranial abnormality. ASPECTS is 10. 2. 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 3. Findings communicated to Dr. Sal Khaliqdina at 3:49 am on 06/24/24. 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 Yes Co-Morbidities requiring supervision/potential complications:  - Atrial fibrillation -Hypertension -Poststroke dysphagia -Rheumatoid arthritis -History of depression Due to bladder management, bowel management, safety, skin/wound care, disease management, medication administration, pain management, and patient education, does the patient require 24 hr/day rehab nursing? Yes Does the patient require coordinated care of a physician, rehab nurse, therapy disciplines of PT, OT, SLP to address physical and functional deficits in the context of the above medical diagnosis(es)? Yes Addressing deficits in the following areas: balance, endurance, locomotion, strength, transferring, bowel/bladder control, bathing, dressing, feeding, grooming, toileting, speech, swallowing, and psychosocial support Can the patient actively participate in an intensive therapy program of at least 3 hrs of therapy per day at least 5 days per week? Yes The potential for patient to make measurable gains while on inpatient rehab is excellent Anticipated functional outcomes upon discharge from inpatient rehab are modified independent and supervision  with PT, modified independent, supervision, and min assist with OT, modified independent and  supervision with  SLP. Estimated rehab length of stay to reach the above functional goals is: 12-15 days Anticipated discharge destination: Home Overall Rehab/Functional Prognosis: excellent  POST ACUTE RECOMMENDATIONS: This patient's condition is appropriate for continued rehabilitative care in the following setting: CIR Patient has agreed to participate in recommended program. Potentially Note that insurance prior authorization may be required for reimbursement for recommended care.  Comment: When I first began speaking to Mr. Vera he was determined to go home. Family  was at bedside (daughter).  After discussing the fact that [facility] will only provide limited therapy and assist, he seemed to become slightly more open to inpatient rehab. We talked about the fact that he needs to be stronger and more steady with mobility and self-care not only for himself but for his wife and caregivers. Once he completes inpatient rehab, he could go home with home health services and other follow up. He told me he would discuss more with his family including his wife. Daughter appeared to be VERY interested in him coming to inpatient rehab. Rehab Admissions Coordinator to follow up.       I have personally performed a face to face diagnostic evaluation of this patient. Additionally, I have examined the patient's medical record including any pertinent labs and radiographic images.    Thanks,  Arthea ONEIDA Gunther, MD 06/25/2024

## 2024-06-25 NOTE — Progress Notes (Signed)
 Speech Language Pathology Treatment: Dysphagia  Patient Details Name: Jackson Lawson MRN: [id number] DOB: Sep 15, 1941 Today's Date: 06/25/2024 Time: 8468-8446 SLP Time Calculation (min) (ACUTE ONLY): 22 min  Assessment / Plan / Recommendation Clinical Impression  SLP had received a message that pt was pocketing food earlier today. Pt and his family say that he had a lot of trouble with ground meat, and that he had to try to get it back out of his mouth because he felt like he could not swallow it. They deny any difficulty with other foods (including other chopped meats) that have come on his meal trays, but acknowledge that intake is limited due to distaste. He did not want to eat anything else from his tray but SLP provided a snack that was similar to a Dys 2 texture along with honey thick liquids, with no overt signs of aspiration and no pocketing or anterior loss noted. For now, he wants to stay on current diet, but education was offered about softer option if he continues to have difficulty with his trays.    HPI HPI: 83 yo male presenting 8/17 with L sided weakness and slurred speech. 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 PMH includes: CAD, HTN, afib, RA. MRI also showed remote infarcts in the thalami (R>L), L caudate head, and L corona radiata. Esophagram in 2006 indicating reflux and hiatal hernia.      SLP Plan  Continue with current plan of care          Recommendations  Diet recommendations: Dysphagia 2 (fine chop);Honey-thick liquid Liquids provided via: Cup Medication Administration: Crushed with puree Supervision: Staff to assist with self feeding;Full supervision/cueing for compensatory strategies Compensations: Slow rate;Small sips/bites;Lingual sweep for clearance of pocketing;Monitor for anterior loss Postural Changes and/or Swallow Maneuvers: Seated upright 90 degrees;Upright 30-60 min after meal                  Oral care QID;Oral care prior to  ice chip/H20   Frequent or constant Supervision/Assistance Dysphagia, oropharyngeal phase (R13.12)     Continue with current plan of care     Leita SAILOR., M.A. CCC-SLP Acute Rehabilitation Services Office: [phone]  Secure chat preferred   06/25/2024, 4:09 PM

## 2024-06-25 NOTE — Plan of Care (Signed)

## 2024-06-25 NOTE — PMR Pre-admission (Shared)
 PMR Admission Coordinator Pre-Admission Assessment  Patient: Jackson Lawson is an 83 y.o., male MRN: [id number] DOB: 21-Dec-1940 Height: 6' 2 (188 cm) Weight: 73.9 kg              Insurance Information HMO: ***    PPO: ***     PCP: ***     IPA: ***     80/20: ***     OTHER: *** PRIMARY: [employer] medicare      Policy#: [id number]      Subscriber: patient CM Name: ***      Phone#: ***     Fax#: *** Pre-Cert#: ***      Employer: *** Benefits:  Phone #: ***     Name: *** Eustacio. Date: ***     Deduct: ***      Out of Pocket Max: ***      Life Max: ***  CIR: ***      SNF: *** Outpatient: ***     Co-Pay: *** Home Health: ***      Co-Pay: *** DME: ***     Co-Pay: *** Providers: ***   The "Data Collection Information Summary" for patients in Inpatient Rehabilitation Facilities with attached "Privacy Act Statement-Health Care Records" was provided and verbally reviewed with: Patient and Family  Emergency Contact Information Contact Information     Name Relation Home Work Mobile   Sweeden,Janet Spouse [phone]  [phone]      Other Contacts   None on File    Current Medical History  Patient Admitting Diagnosis: CVA History of Present Illness: Jackson Lawson is a 83 y.o. male with a history of CAD, hypertension, A-fib on Eliquis , rheumatoid arthritis who presented to [facility] on 06/24/2024 with left-sided weakness.  CT of the head did not show any acute changes although chronic ischemic white matter changes and multiple small vessel infarcts were seen.  CTA revealed no large vessel occlusion although focal areas of decreased contrast enhancement in both proximal ICAs secondary to turbulent flow versus nonocclusive atherosclerotic webs versus partially adherent thrombus more prominent on the right than the left.  MRI of the brain showed acute/subacute nonhemorrhagic right paramedian pontine infarct as well as remote infarcts in the right greater than left thalami, left caudate head, left corona  radiata.  Neurology feels that strokes were due to small vessel disease.  Patient was on Eliquis  prior to admission.  Now she is on aspirin  and Eliquis .  Patient is on the D2 thin liquid diet for dysphagia and followed by speech language pathology.  Patient was up with therapy yesterday and was mod assist for sit to stand transfers but did not standing or gait just yet due to leg buckling.  Patient was total to mod assist with basic ADLs. Therapies are recommending in Complete NIHSS TOTAL: 3 Glasgow Coma Scale Score: 15  Patient's medical record from [facility] has been reviewed by the rehabilitation admission coordinator and physician.  Past Medical History  History reviewed. No pertinent past medical history.  Has the patient had major surgery during 100 days prior to admission? No  Family History  family history is not on file.   Current Medications   Current Facility-Administered Medications:    [DISCONTINUED] acetaminophen  (TYLENOL ) tablet 650 mg, 650 mg, Oral, Q4H PRN **OR** [DISCONTINUED] acetaminophen  (TYLENOL ) 160 MG/5ML solution 650 mg, 650 mg, Per Tube, Q4H PRN **OR** acetaminophen  (TYLENOL ) suppository 650 mg, 650 mg, Rectal, Q6H PRN, Sundil, Subrina, MD   acetaminophen  (TYLENOL ) tablet 650 mg, 650 mg,  Oral, Q6H PRN, Samtani, Jai-Gurmukh, MD, 650 mg at 06/24/24 1521   apixaban  (ELIQUIS ) tablet 5 mg, 5 mg, Oral, BID, Toberman, Stevi W, NP, 5 mg at 06/25/24 0801   aspirin  EC tablet 81 mg, 81 mg, Oral, Daily, Toberman, Stevi W, NP, 81 mg at 06/25/24 0801   labetalol  (NORMODYNE ) injection 10 mg, 10 mg, Intravenous, Q8H PRN, Sundil, Subrina, MD   nitroGLYCERIN  (NITROSTAT ) SL tablet 0.4 mg, 0.4 mg, Sublingual, Q5 min PRN, Singh, Prashant K, MD   ondansetron  (ZOFRAN ) injection 4 mg, 4 mg, Intravenous, Q6H PRN, Samtani, Jai-Gurmukh, MD, 4 mg at 06/24/24 1521   rosuvastatin  (CRESTOR ) tablet 10 mg, 10 mg, Oral, Daily, Singh, Prashant K, MD, 10 mg at 06/25/24 0801  Patients Current Diet:   Diet Order             DIET DYS 2 Room service appropriate? Yes; Fluid consistency: Honey Thick  Diet effective now                   Precautions / Restrictions Precautions Precautions: Fall Precaution/Restrictions Comments: SBP goal: permissive hypertension first 24 h < 220/110. Restrictions Weight Bearing Restrictions Per Provider Order: No   Has the patient had 2 or more falls or a fall with injury in the past year?No  Prior Activity Level    Prior Functional Level Prior Function Prior Level of Function : Independent/Modified Independent, History of Falls (last six months) Mobility Comments: He sleeps in a recliner chair a lot. Ambulates using SPC. Pt reports he forgets it sometimes. Several falls in the past 6 mo, estimating 1 a month, but wife reports he hasn't fallen recently. ADLs Comments: Indep with ADLs. Wife manages IADLs including houshold chores, cooking, and managing medications. Pt reports he drives very little. Wife states they just don't go anywhere.  Self Care: Did the patient need help bathing, dressing, using the toilet or eating?  Independent  Indoor Mobility: Did the patient need assistance with walking from room to room (with or without device)? Independent  Stairs: Did the patient need assistance with internal or external stairs (with or without device)? Independent  Functional Cognition: Did the patient need help planning regular tasks such as shopping or remembering to take medications? Independent  Patient Information Are you of Hispanic, Latino/a,or Spanish origin?: A. No, not of Hispanic, Latino/a, or Spanish origin What is your race?: A. White Do you need or want an interpreter to communicate with a doctor or health care staff?: 0. No  Patient's Response To:  Health Literacy and Transportation Is the patient able to respond to health literacy and transportation needs?: Yes Health Literacy - How often do you need to have someone help you  when you read instructions, pamphlets, or other written material from your doctor or pharmacy?: Never In the past 12 months, has lack of transportation kept you from medical appointments or from getting medications?: No In the past 12 months, has lack of transportation kept you from meetings, work, or from getting things needed for daily living?: No  Home Assistive Devices / Equipment Home Equipment: Cane - single point, Shower seat  Prior Device Use: Indicate devices/aids used by the patient prior to current illness, exacerbation or injury? None of the above  Current Functional Level Cognition  Arousal/Alertness: Awake/alert Overall Cognitive Status: Impaired/Different from baseline Orientation Level: Oriented X4 Attention: Sustained Sustained Attention: Appears intact Awareness: Impaired Awareness Impairment: Other (comment) (online) Safety/Judgment: Impaired Comments: flat affect    Extremity Assessment (includes Sensation/Coordination)  Upper  Extremity Assessment: Right hand dominant, LUE deficits/detail (bil dupytren contractures at baseline with family reporting he predominantly has use of 1st and 2nd digits bilaterally) LUE Deficits / Details: generally weak, 2+/5 strength  Lower Extremity Assessment: Defer to PT evaluation (denied changes in sensation, able to detect light touch bilaterally) LLE Deficits / Details: Pt demonstraed limited AROM. PROM WFL. Grossly 2+/5 strength. LLE Sensation: WNL LLE Coordination: decreased gross motor    ADLs  Overall ADL's : Needs assistance/impaired Eating/Feeding: Set up, Bed level Grooming: Set up, Bed level Upper Body Bathing: Moderate assistance, Sitting Lower Body Bathing: Sitting/lateral leans, Sit to/from stand, +2 for safety/equipment, Maximal assistance Upper Body Dressing : Moderate assistance, Sitting Lower Body Dressing: Total assistance Lower Body Dressing Details (indicate cue type and reason): for socks Toilet Transfer:  Moderate assistance, +2 for physical assistance, +2 for safety/equipment Toilet Transfer Details (indicate cue type and reason): sts only Functional mobility during ADLs: Moderate assistance, +2 for physical assistance, +2 for safety/equipment General ADL Comments: STS only    Mobility  Overal bed mobility: Needs Assistance Bed Mobility: Supine to Sit, Sit to Supine Supine to sit: Mod assist Sit to supine: Mod assist, +2 for safety/equipment General bed mobility comments: Pt sat up on R side of stretcher with increased time. Cues for sequencing. Assist to bring LLE off EOB and elevate trunk. Returning to bed pt controlled trunk, assist to bring BLE back into bed, and manage lines/leads.    Transfers  Overall transfer level: Needs assistance Equipment used: 2 person hand held assist Transfers: Sit to/from Stand Sit to Stand: Mod assist, +2 physical assistance, +2 safety/equipment, Max assist General transfer comment: assist to power up; once hips cleared from EOB, pt attempts to lift LLE up off floor but denies pain but poor tolerance of weight bearing and RLE buckling needing knee block. second attempt with block to bil sides of L knee, greater tolerance of weightbearing, but continues to require modA    Ambulation / Gait / Stairs / Engineer, drilling / Balance Dynamic Sitting Balance Sitting balance - Comments: min-mod A at EOB; brief bout of CGA Balance Overall balance assessment: Needs assistance Sitting-balance support: No upper extremity supported, Feet supported, Bilateral upper extremity supported Sitting balance-Leahy Scale: Poor Sitting balance - Comments: min-mod A at EOB; brief bout of CGA Postural control: Left lateral lean, Posterior lean Standing balance support: Bilateral upper extremity supported, During functional activity Standing balance-Leahy Scale: Poor Standing balance comment: poor to 0; needs 2 person assist for static standing    Special  considerations/ Life events Skin ***     Previous Home Environment (from acute therapy documentation) Living Arrangements: Spouse/significant other  Lives With: Spouse Available Help at Discharge: Family, Available 24 hours/day Type of Home: House Home Layout: One level Alternate Level Stairs-Rails: Left Alternate Level Stairs-Number of Steps: 2 Home Access: Stairs to enter Entrance Stairs-Rails: None Entrance Stairs-Number of Steps: 1 Bathroom Shower/Tub: Associate Professor: Yes How Accessible: Accessible via walker Home Care Services: No  Discharge Living Setting Plans for Discharge Living Setting: Patient's home, Lives with (comment) (spouse) Discharge Home Layout: One level Discharge Home Access: Stairs to enter Entrance Stairs-Rails: None Entrance Stairs-Number of Steps: 1 Discharge Bathroom Shower/Tub: Tub/shower unit Discharge Bathroom Toilet: Standard Discharge Bathroom Accessibility: Yes How Accessible: Accessible via walker Does the patient have any problems obtaining your medications?: No  Social/Family/Support Systems Anticipated Caregiver: Alfreda Hoose Anticipated Industrial/product designer Information: [phone] Caregiver  Availability: 24/7 Discharge Plan Discussed with Primary Caregiver: Yes Is Caregiver In Agreement with Plan?: Yes Does Caregiver/Family have Issues with Lodging/Transportation while Pt is in Rehab?: No   Goals Patient/Family Goal for Rehab: Mod I PT, OT, independent SLP Expected length of stay: 12-15 days Pt/Family Agrees to Admission and willing to participate: Yes Program Orientation Provided & Reviewed with Pt/Caregiver Including Roles  & Responsibilities: Yes  Barriers to Discharge: Insurance for SNF coverage   Decrease burden of Care through IP rehab admission: Othern/a   Possible need for SNF placement upon discharge:not anticipated   Patient Condition: This patient's medical and  functional status has changed since the consult dated ______ in which the Rehabilitation Physician determined and documented that the patient was potentially appropriate for intensive rehabilitative care in an inpatient rehabilitation facility. Issues have been addressed and update has been discussed with Dr. Babs and patient now appropriate for inpatient rehabilitation. Will admit to inpatient rehab today.   Preadmission Screen Completed By:  COSMO VEAR PLATTER, 06/25/2024 2:58 PM ______________________________________________________________________   Discussed status with Dr. PIERRETTEon***at *** and received approval for admission today.  Admission Coordinator:  COSMO VEAR PLATTER, time***/Date***

## 2024-06-25 NOTE — TOC Initial Note (Addendum)
 Transition of Care [facility]) - Initial/Assessment Note    Patient Details  Name: Jackson Lawson MRN: [id number] Date of Birth: [birth year]  [facility]) CM/SW Contact:    Jackson Lawson, LCSWA Phone Number: 06/25/2024, 11:33 AM  Clinical Narrative:     CSW spoke with patient at bedside, who is oriented x4, to discuss PT recs for CIR and backup plan (SNF or HH). Patient stated he wants to return home and does not want to go to SNF at all. CSW reviewed PT note with patient and encouraged patient to think about rehab to get stronger before discharging home. Patient stated he lives at home with his wife and his two daughters are present as well, patient stated he informed MD that he wants to go home. CSW asked for patients permission to contact his family, patient agreed.  CSW completed assessment with patient. Patient reports having [facility] [place]. Patient reports he utilizes mail system or medications also. Patient reports having the following DME: rolling walker. Patient stated he has no prior [facility] or SNF hx, patient stated a nurse with [facility] insurance checks on him once a year. Patient stated at time of discharge his wife or daughters can transport him.   CSW attempted to call Jackson Lawson (spouse) at [phone] but the number is not working. CSW conducted a chart review to find other family member contacts. CSW was able to find patients daughters number, Jackson Lawson, on ROI form [phone]). CSW left VM for [phone] to call back when able and did not leave any patient information on the voicemail. Awaiting a call back.  11:46 AM Jackson Lawson called CSW back from the [phone] number. Jackson Lawson provided CSW with her mothers updated contact information. Jackson Lawson's home number [phone]; mobile number [phone].   CSW informed Jackson Lawson of PT's recs for CIR and that patient stated he would rather go home. Jackson Lawson is agreeable to patient discharging home with home health. CSW notified  treatment team.   CSW will continue to follow.    Expected Discharge Plan: Home/Self Care Barriers to Discharge: Continued Medical Work up   Patient Goals and CMS Choice Patient states their goals for this hospitalization and ongoing recovery are:: To go home          Expected Discharge Plan and Services In-house Referral: Clinical Social Work Discharge Planning Services: CM Consult   Living arrangements for the past 2 months: Single Family Home                                      Prior Living Arrangements/Services Living arrangements for the past 2 months: Single Family Home Lives with:: Spouse Patient language and need for interpreter reviewed:: Yes Do you feel safe going back to the place where you live?: Yes      Need for Family Participation in Patient Care: Yes (Comment) Care giver support system in place?: Yes (comment) Current home services: DME (RW) Criminal Activity/Legal Involvement Pertinent to Current Situation/Hospitalization: No - Comment as needed  Activities of Daily Living   ADL Screening (condition at time of admission) Independently performs ADLs?: No Does the patient have a NEW difficulty with bathing/dressing/toileting/self-feeding that is expected to last >3 days?: Yes (Initiates electronic notice to provider for possible OT consult) Does the patient have a NEW difficulty with getting in/out of bed, walking, or climbing stairs that  is expected to last >3 days?: Yes (Initiates electronic notice to provider for possible PT consult) Does the patient have a NEW difficulty with communication that is expected to last >3 days?: Yes (Initiates electronic notice to provider for possible SLP consult) Is the patient deaf or have difficulty hearing?: Yes Does the patient have difficulty seeing, even when wearing glasses/contacts?: Yes Does the patient have difficulty concentrating, remembering, or making decisions?: No  Permission  Sought/Granted Permission sought to share information with : Family Supports, Case Manager Permission granted to share information with : Yes, Verbal Permission Granted  Share Information with NAME: Jackson Lawson,Jackson Lawson     Permission granted to share info w Relationship: spouse  Permission granted to share info w Contact Information: [phone]  Emotional Assessment Appearance:: Appears stated age Attitude/Demeanor/Rapport: Engaged Affect (typically observed): Pleasant Orientation: : Oriented to Self, Oriented to Place, Oriented to  Time, Oriented to Situation Alcohol / Substance Use: Not Applicable Psych Involvement: No (comment)  Admission diagnosis:  Neurological deficit present [R29.818] 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 AT [facility] [address] [place] [address] Phone: [phone] Fax: [phone]  [facility] Delivery - [place] - [address] [place] [address] Phone: [phone] Fax: [phone]     Social Drivers of Health (SDOH) Social History: SDOH Screenings   Food Insecurity: No Food Insecurity (06/20/2024)   Received from [facility]  Transportation Needs: No Transportation Needs (06/20/2024)   Received from [facility]  Utilities: Low Risk  (06/20/2024)    Received from [facility]  Financial Resource Strain: Low Risk  (06/21/2023)   Received from [facility]  Physical Activity: Inactive (06/21/2023)   Received from [facility]  Social Connections: Moderately Isolated (06/21/2023)   Received from [facility]  Stress: No Stress Concern Present (06/21/2023)   Received from [facility]  Tobacco Use: Low Risk  (06/24/2024)  Health Literacy: Low Risk  (06/21/2023)   Received from [facility]   SDOH Interventions:     Readmission Risk Interventions     No data to display

## 2024-06-25 NOTE — TOC Progression Note (Addendum)
 Transition of Care [facility]) - Progression Note    Patient Details  Name: Jackson Lawson MRN: [id number] Date of Birth: 1941/06/24  Transition [facility]) CM/SW Contact  Robynn Eileen Hoose, RN Phone Number: 06/25/2024, 2:40 PM  Clinical Narrative:   Spoke with family at bedside. Confirmed that patient did not want to go to inpatient rehab. Family has no preference for [facility] agency, as long as it accepts his insurance. Message to [place] with [facility] for [facility] referral, [place] office is at capacity for new intakes. Call to [facility] agency, they do not service [place]. Message to Amy with [facility], awaiting response.  1602: Message received from Amy with [facility], able to accept patient for home health PT/OT. Contact information placed on AVS. Wife updated, wants to still try to convince pt to go to CIR. CIR is following patient.   Expected Discharge Plan: Home/Self Care Barriers to Discharge: Continued Medical Work up               Expected Discharge Plan and Services In-house Referral: Clinical Social Work Discharge Planning Services: CM Consult   Living arrangements for the past 2 months: Single Family Home                                       Social Drivers of Health (SDOH) Interventions SDOH Screenings   Food Insecurity: No Food Insecurity (06/20/2024)   Received from [facility]  Transportation Needs: No Transportation Needs (06/20/2024)   Received from [facility]  Utilities: Low Risk  (06/20/2024)   Received from [facility]  Financial Resource Strain: Low Risk  (06/21/2023)   Received from [facility]  Physical Activity: Inactive (06/21/2023)   Received from [facility]  Social Connections: Moderately Isolated (06/21/2023)   Received from [facility]  Stress: No Stress Concern Present (06/21/2023)   Received from [facility]  Tobacco Use: Low Risk  (06/24/2024)  Health Literacy: Low Risk  (06/21/2023)   Received from [facility]    Readmission Risk Interventions     No data to display

## 2024-06-25 NOTE — Progress Notes (Signed)
  Echocardiogram 2D Echocardiogram has been performed.  Jackson Lawson 06/25/2024, 4:29 PM

## 2024-06-25 NOTE — Progress Notes (Signed)
 PROGRESS NOTE                                                                                                                                                                                                             Patient Demographics:    Jackson Lawson, is a 83 y.o. male, DOB - 04-02-1941, FMW:[id number]  Outpatient Primary MD for the patient is Pcp, No    LOS - 1  Admit date - 06/24/2024    Chief Complaint  Patient presents with   Code Stroke       Brief Narrative (HPI from H&P)   83 y.o. male with medical history significant of paroxysmal tribulation, essential hypertension, hyperlipidemia and CAD presented to emergency department with complaining of left-sided weakness, left-sided facial droop and slurred speech started around 02:15 a.m.  Last known well around 7 PM 8/16 last night when the patient went to bed.  Patient reported that he went to bed midnight and woke up middle of the night with sudden onset of above symptoms, his workup suggestive of acute stroke and was admitted to the hospital.   Subjective:    Jackson Lawson today has, No headache, No chest pain, No abdominal pain - No Nausea, No new weakness tingling or numbness, shortness of breath, continued left-sided weakness.   Assessment  & Plan :   Acute left-sided hemiparesis and dysarthria.  Due to acute right pontine ischemic infarct, undergoing full stroke workup seen by stroke team, echo pending, PT OT and speech following, CIR recommended, currently on Eliquis  and aspirin , Crestor  added as LDL was above goal, A1c stable.  Continue to monitor.  Paroxysmal atrial fibrillation  - Continue Eliquis .     Essential hypertension Hold home blood pressure regimen for 24 hours for permissive hypertension.  At home takes combination of Norvasc and Hyzaar.   Hyperlipidemia History of CAD -Holding home medications as patient failed swallow screen and NPO.    Rheumatoid arthritis -Wife reported that patient is not any medication for rheumatoid arthritis anymore.        Condition - Extremely Guarded  Family Communication  : Phone number listed for wife not working called 06/25/2024 at 9:50 AM [phone]  Code Status : Full code  Consults  : Neurology  PUD Prophylaxis :     Procedures  :     Pending  echocardiogram.    MRI brain, CTA head and neck, MR angiogram head and neck.  Noted.      Disposition Plan  :    Status is: Inpatient   DVT Prophylaxis  :    SCD's Start: 06/24/24 0444 apixaban  (ELIQUIS ) tablet 5 mg     Lab Results  Component Value Date   PLT 88 (L) 06/25/2024    Diet :  Diet Order             DIET DYS 2 Room service appropriate? Yes; Fluid consistency: Honey Thick  Diet effective now                    Inpatient Medications  Scheduled Meds:  apixaban   5 mg Oral BID   aspirin  EC  81 mg Oral Daily   potassium chloride   40 mEq Oral Once   rosuvastatin   10 mg Oral Daily   Continuous Infusions: PRN Meds:.[DISCONTINUED] acetaminophen  **OR** [DISCONTINUED] acetaminophen  (TYLENOL ) oral liquid 160 mg/5 mL **OR** acetaminophen , acetaminophen , labetalol , nitroGLYCERIN , ondansetron  (ZOFRAN ) IV  Antibiotics  :    Anti-infectives (From admission, onward)    None         Objective:   Vitals:   06/24/24 2000 06/24/24 2355 06/25/24 0400 06/25/24 0738  BP: 121/63 (!) 140/69 134/79 (!) 146/83  Pulse: 66 68 69 70  Resp: 20 20 17 20   Temp: 97.6 F (36.4 C) 98.6 F (37 C) 98.1 F (36.7 C) 98.3 F (36.8 C)  TempSrc: Oral Oral Oral Oral  SpO2: 95% 94% 93% 95%  Weight:      Height:        Wt Readings from Last 3 Encounters:  06/24/24 73.9 kg  09/10/22 70.5 kg  12/03/20 76.6 kg     Intake/Output Summary (Last 24 hours) at 06/25/2024 0955 Last data filed at 06/25/2024 0900 Gross per 24 hour  Intake 2402.68 ml  Output 700 ml  Net 1702.68 ml     Physical Exam  Awake Alert, No new  F.N deficits, continues to have mild left-sided weakness and some problems swallowing food and liquids .AT,PERRAL Supple Neck, No JVD,   Symmetrical Chest wall movement, Good air movement bilaterally, CTAB RRR,No Gallops,Rubs or new Murmurs,  +ve B.Sounds, Abd Soft, No tenderness,   No Cyanosis, Clubbing or edema     Data Review:    Recent Labs  Lab 06/24/24 0342 06/24/24 0347 06/24/24 0642 06/25/24 0436  WBC 10.0  --  9.9 8.0  HGB 14.2 15.6 13.9 12.9*  HCT 43.6 46.0 42.8 39.1  PLT 96*  --  89* 88*  MCV 92.2  --  92.4 90.9  MCH 30.0  --  30.0 30.0  MCHC 32.6  --  32.5 33.0  RDW 13.5  --  13.6 13.9  LYMPHSABS 2.1  --   --  1.4  MONOABS 0.9  --   --  0.7  EOSABS 1.0*  --   --  0.4  BASOSABS 0.1  --   --  0.0    Recent Labs  Lab 06/24/24 0342 06/24/24 0347 06/24/24 0642 06/25/24 0436  NA 138 140 139 138  K 3.3* 3.2* 3.8 3.3*  CL 102 104 102 108  CO2 23  --  25 25  ANIONGAP 13  --  12 5  GLUCOSE 136* 135* 149* 119*  BUN 15 18 14 12   CREATININE 1.10 1.00 1.07 1.08  AST 17  --  18  --   ALT 10  --  11  --   ALKPHOS 59  --  60  --   BILITOT 0.4  --  0.6  --   ALBUMIN 3.4*  --  3.4*  --   INR 1.3*  --   --   --   HGBA1C  --   --  6.0*  --   CALCIUM  9.1  --  9.1 8.6*      Recent Labs  Lab 06/24/24 0342 06/24/24 0642 06/25/24 0436  INR 1.3*  --   --   HGBA1C  --  6.0*  --   CALCIUM  9.1 9.1 8.6*    --------------------------------------------------------------------------------------------------------------- Lab Results  Component Value Date   CHOL 132 06/24/2024   HDL 42 06/24/2024   LDLCALC 80 06/24/2024   TRIG 48 06/24/2024   CHOLHDL 3.1 06/24/2024    Lab Results  Component Value Date   HGBA1C 6.0 (H) 06/24/2024   No results for input(s): TSH, T4TOTAL, FREET4, T3FREE, THYROIDAB in the last 72 hours. No results for input(s): VITAMINB12, FOLATE, FERRITIN, TIBC, IRON, RETICCTPCT in the last 72  hours. ------------------------------------------------------------------------------------------------------------------ Cardiac Enzymes No results for input(s): CKMB, TROPONINI, MYOGLOBIN in the last 168 hours.  Invalid input(s): CK  Micro Results No results found for this or any previous visit (from the past 240 hours).  Radiology Report DG Swallowing Func-Speech Pathology Result Date: 06/24/2024 Modified Barium Swallow Study Patient Details Name: ABDULKARIM EBERLIN MRN: [id number] Date of Birth: 11/11/1940 Today's Date: 06/24/2024 HPI/PMH: HPI: 83 yo male presenting 8/17 with L sided weakness and slurred speech. 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 PMH includes: CAD, HTN, afib, RA. MRI also showed remote infarcts in the thalami (R>L), L caudate head, and L corona radiata. Esophagram in 2006 indicating reflux and hiatal hernia. Clinical Impression: Clinical Impression: Pt has an oropharyngeal dysphagia marked by impaired timing, weakness, and reduced sensation. Labial seal is reduced and lingual propulsion is slow, leading to small amounts of anterior loss as well as oral residue that sometimes spills back toward the pharynx. Thin and nectar thick liquids spill to the pyrifrom sinuses before the swallow and because they do, they are able to spill into the airway because he also has reduced hyolaryngeal movement and laryngeal vestibule closure. Airway protection is improved when boluses are better contained above the valleculae before the swallow begins, which happens with honey thick liquids and solids. Attempted a chin tuck posture which can better contain nectar thick liquids, although not when boluses start to increase in size. Sensation of aspiration is volume dependent (mostly PAS 8; one instance of PAS 7 with thin liquids with chin tuck given larger volume). Recommend starting with Dys 2 (finely chopped) diet and honey thick liquids with ongoing SLP f/u warranted. DIGEST  Swallow Severity Rating*             Safety: 3             Efficiency:0             Overall Pharyngeal Swallow Severity: 3 1: mild; 2: moderate; 3: severe; 4: profound *The Dynamic Imaging Grade of Swallowing Toxicity is standardized for the head and neck cancer population, however, demonstrates promising clinical applications across populations to standardize the clinical rating of pharyngeal swallow safety and severity.  Factors that may increase risk of adverse event in presence of aspiration Noe & Lianne 2021): Factors that may increase risk of adverse event in presence of aspiration Noe & Lianne 2021): Limited mobility; Frail or deconditioned; Dependence for  feeding and/or oral hygiene; Weak cough; Aspiration of thick, dense, and/or acidic materials Recommendations/Plan: Swallowing Evaluation Recommendations Swallowing Evaluation Recommendations Recommendations: PO diet PO Diet Recommendation: Dysphagia 2 (Finely chopped); Moderately thick liquids (Level 3, honey thick) Liquid Administration via: Cup; Straw Medication Administration: Whole meds with puree Supervision: Staff to assist with self-feeding; Full supervision/cueing for swallowing strategies Swallowing strategies  : Slow rate; Small bites/sips; Check for pocketing or oral holding; Check for anterior loss Postural changes: Position pt fully upright for meals; Stay upright 30-60 min after meals Oral care recommendations: Oral care BID (2x/day) Treatment Plan Treatment Plan Treatment recommendations: Therapy as outlined in treatment plan below Follow-up recommendations: Acute inpatient rehab (3 hours/day) Functional status assessment: Patient has had a recent decline in their functional status and demonstrates the ability to make significant improvements in function in a reasonable and predictable amount of time. Treatment frequency: Min 2x/week Treatment duration: 2 weeks Interventions: Aspiration precaution training; Compensatory techniques;  Patient/family education; Trials of upgraded texture/liquids; Diet toleration management by SLP Recommendations Recommendations for follow up therapy are one component of a multi-disciplinary discharge planning process, led by the attending physician.  Recommendations may be updated based on patient status, additional functional criteria and insurance authorization. Assessment: Orofacial Exam: Orofacial Exam Oral Cavity - Dentition: Adequate natural dentition; Missing dentition Orofacial Anatomy: WFL Oral Motor/Sensory Function: Suspected cranial nerve impairment CN VII - Facial: Left motor impairment Anatomy: Anatomy: WFL Boluses Administered: Boluses Administered Boluses Administered: Thin liquids (Level 0); Mildly thick liquids (Level 2, nectar thick); Moderately thick liquids (Level 3, honey thick); Puree; Solid  Oral Impairment Domain: Oral Impairment Domain Lip Closure: Escape progressing to mid-chin Tongue control during bolus hold: Escape to lateral buccal cavity/floor of mouth Bolus preparation/mastication: Slow prolonged chewing/mashing with complete recollection Bolus transport/lingual motion: Slow tongue motion Oral residue: Residue collection on oral structures Location of oral residue : Tongue (spills back to pharynx) Initiation of pharyngeal swallow : Pyriform sinuses  Pharyngeal Impairment Domain: Pharyngeal Impairment Domain Soft palate elevation: No bolus between soft palate (SP)/pharyngeal wall (PW) Laryngeal elevation: Partial superior movement of thyroid  cartilage/partial approximation of arytenoids to epiglottic petiole Anterior hyoid excursion: Partial anterior movement Epiglottic movement: Partial inversion Laryngeal vestibule closure: Incomplete, narrow column air/contrast in laryngeal vestibule Pharyngeal stripping wave : Present - complete Pharyngeal contraction (A/P view only): N/A Pharyngoesophageal segment opening: Complete distension and complete duration, no obstruction of flow Tongue  base retraction: No contrast between tongue base and posterior pharyngeal wall (PPW) Pharyngeal residue: Trace residue within or on pharyngeal structures Location of pharyngeal residue: Pyriform sinuses  Esophageal Impairment Domain: Esophageal Impairment Domain Esophageal clearance upright position: Esophageal retention Pill: Pill Consistency administered: Mildly thick liquids (Level 2, nectar thick) Mildly thick liquids (Level 2, nectar thick): Impaired (see clinical impressions) Penetration/Aspiration Scale Score: Penetration/Aspiration Scale Score 1.  Material does not enter airway: Puree; Solid; Pill 8.  Material enters airway, passes BELOW cords without attempt by patient to eject out (silent aspiration) : Thin liquids (Level 0); Mildly thick liquids (Level 2, nectar thick) Compensatory Strategies: Compensatory Strategies Compensatory strategies: Yes Straw: Ineffective Ineffective Straw: Thin liquid (Level 0); Mildly thick liquid (Level 2, nectar thick) Chin tuck: Ineffective Ineffective Chin Tuck: Thin liquid (Level 0); Mildly thick liquid (Level 2, nectar thick)   General Information: Caregiver present: No  Diet Prior to this Study: NPO   Temperature : Normal   Respiratory Status: WFL   Supplemental O2: None (Room air)   History of Recent Intubation: No  Behavior/Cognition: Alert; Cooperative;  Pleasant mood Self-Feeding Abilities: Dependent for feeding Baseline vocal quality/speech: Normal Volitional Cough: Able to elicit Volitional Swallow: Able to elicit Exam Limitations: No limitations Goal Planning: Prognosis for improved oropharyngeal function: Good No data recorded No data recorded Patient/Family Stated Goal: none stated Consulted and agree with results and recommendations: Patient Pain: Pain Assessment Pain Assessment: Faces Faces Pain Scale: 0 Pain Location: generalized Pain Descriptors / Indicators: Discomfort Pain Intervention(s): Monitored during session End of Session: Start Time:SLP Start Time  (ACUTE ONLY): 1140 Stop Time: SLP Stop Time (ACUTE ONLY): 1157 Time Calculation:SLP Time Calculation (min) (ACUTE ONLY): 17 min Charges: SLP Evaluations $ SLP Speech Visit: 1 Visit SLP Evaluations $BSS Swallow: 1 Procedure $MBS Swallow: 1 Procedure $ SLP EVAL LANGUAGE/SOUND PRODUCTION: 1 Procedure SLP visit diagnosis: SLP Visit Diagnosis: Dysphagia, oropharyngeal phase (R13.12) Past Medical History: No past medical history on file. Past Surgical History: No past surgical history on file. Leita SQUIBB Nix 06/24/2024, 1:34 PM  MR ANGIO HEAD WO CONTRAST Result Date: 06/24/2024 EXAM: MR Angiography Head without intravenous Contrast. 06/24/2024 07:03:00 AM TECHNIQUE: Magnetic resonance angiography images of the head without intravenous contrast. Multiplanar 2D and 3D reformatted images are provided for review. 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 Abnormal CT angiography of the neck. SABRA FINDINGS: ANTERIOR CIRCULATION: The left A1 segment is dominant. Mild atherosclerotic changes are present within the cavernous internal carotid arteries bilaterally without significant stenoses. No aneurysm. POSTERIOR CIRCULATION: No significant stenosis of the posterior cerebral arteries. No significant stenosis of the basilar artery. No significant stenosis of the vertebral arteries. No aneurysm. IMPRESSION: 1. No significant stenosis of the intracranial vasculature. 2. Mild atherosclerotic changes within the cavernous internal carotid arteries bilaterally. Electronically signed by: Lonni Necessary MD 06/24/2024 08:23 AM EDT RP Workstation: [id number]   MR ANGIO NECK W WO CONTRAST Result Date: 06/24/2024 EXAM: MRA Neck without and with contrast 06/24/2024 07:05:00 AM TECHNIQUE: Multiplanar multisequence MRA of the neck was performed without and with the administration of 7 mL intravenous gadobutrol  (GADAVIST ) 1 MMOL/ML. 2D and 3D  reformatted images are provided for review. Stenosis of the internal carotid arteries is measured using NASCET criteria. 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 FINDINGS: CAROTID ARTERIES: Noncontrast time-of-flight images demonstrate signal loss in the proximal internal and external carotid arteries bilaterally with significant motion artifact at this level. Post contrast images demonstrate no significant stenosis or web. Some venous contamination is present. Moderate tortuosity is present in the mid cervical ICA bilaterally without significant stenosis. VERTEBRAL ARTERIES: Flow is antegrade in the vertebral arteries bilaterally. No focal stenosis is present. IMPRESSION: 1. No hemodynamically significant stenosis or dissection of the neck arteries. 2. Moderate tortuosity in the mid cervical ICA bilaterally without significant stenosis. Electronically signed by: Lonni Necessary MD 06/24/2024 08:21 AM EDT RP Workstation: [id number]   MR BRAIN WO CONTRAST Result Date: 06/24/2024 EXAM: MRI BRAIN WITHOUT CONTRAST 06/24/2024 07:03:47 AM TECHNIQUE: Multiplanar multisequence MRI of the head/brain was performed without the administration of intravenous contrast. COMPARISON: None available. CLINICAL HISTORY: Neuro deficit, acute, stroke suspected. 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 No acute supratentorial infarct is present. Mild T2 signal changes are associated with the acute infarct. Remote infarcts are present in the thalami, right greater than left. A remote lacunar infarct is present in the left caudate head. Remote lacunar  infarcts are also present in the left corona radiata. Moderate atrophy and white matter disease are present. Dilated perivascular spaces are present throughout the basal ganglia bilaterally. Scattered  foci of susceptibility are present throughout the basal ganglia and thalami bilaterally. Other scattered foci are present in the parietal greater than frontal lobes. A focal area of susceptibility in the medial left temporal lobe measures 12 mm. No mass. No midline shift. No hydrocephalus. The sella is unremarkable. Normal flow voids. ORBITS: Bilateral lens replacements are noted. The globes and orbits are otherwise within normal limits. SINUSES AND MASTOIDS: No acute abnormality. BONES AND SOFT TISSUES: Normal marrow signal. No acute soft tissue abnormality. IMPRESSION: 1. 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 No acute supratentorial infarct. 2. Remote infarcts in the thalami (right greater than left), left caudate head, and left corona radiata. 3. Moderate atrophy and white matter disease. Electronically signed by: Lonni Necessary MD 06/24/2024 08:18 AM EDT RP Workstation: [id number]   CT ANGIO HEAD NECK W WO CM (CODE STROKE) Result Date: 06/24/2024 EXAM: CTA HEAD AND NECK WITH AND WITHOUT 06/24/2024 03:51:49 AM TECHNIQUE: CTA of the head and neck was performed with and without the administration of intravenous contrast. Multiplanar 2D and/or 3D reformatted images are provided for review. Automated exposure control, iterative reconstruction, and/or weight based adjustment of the mA/kV was utilized to reduce the radiation dose to as low as reasonably achievable. Stenosis of the internal carotid arteries measured using NASCET criteria. COMPARISON: None available CLINICAL HISTORY: Neuro deficit, acute, stroke suspected. Lt side weakness, lt facial droop, slurred speech FINDINGS: CTA NECK: AORTIC ARCH AND ARCH VESSELS: Calcific aortic atherosclerosis. No dissection or arterial injury. No significant stenosis of the brachiocephalic or subclavian arteries. CERVICAL CAROTID ARTERIES: Bilaterally there are areas of decreased  opacification within the proximal internal carotid arteriesy which may be due to turbulent blood flow, though a non-occlusive atherosclerotic web could also have this appearance. The finding on the right is more convincing than that on the left. No hemodynamically significant stenosis of either internal carotid artery. Bilateral tortuous distal internal carotid arteries. CERVICAL VERTEBRAL ARTERIES: No dissection, arterial injury, or significant stenosis. LUNGS AND MEDIASTINUM: Unremarkable. SOFT TISSUES: No acute abnormality. BONES: No acute abnormality. CTA HEAD: ANTERIOR CIRCULATION: Atherosclerotic calcification of the cavernous segments of both internal carotid arteries without hemodynamically significant stenosis. No significant stenosis of the internal carotid arteries. No significant stenosis of the anterior cerebral arteries. No significant stenosis of the middle cerebral arteries. No aneurysm. POSTERIOR CIRCULATION: No significant stenosis of the posterior cerebral arteries. No significant stenosis of the basilar artery. No significant stenosis of the vertebral arteries. No aneurysm. OTHER: No dural venous sinus thrombosis on this non-dedicated study. IMPRESSION: 1. No large vessel occlusion, hemodynamically significant stenosis, or aneurysm in the head or neck. 2. Focal areas of decreased contrast enhancement in both proximal internal carotid arteries may be secondary to turbulent flow, though non-occlusive atherosclerotic webs or partially adherent thrombus may also have this appearance. The finding on the right is more convincing. 3. Case discussed with Dr. Vanessa at 4:00 am on 06/24/24. Electronically signed by: Franky Stanford MD 06/24/2024 04:05 AM EDT RP Workstation: [id number]   CT HEAD CODE STROKE WO CONTRAST Result Date: 06/24/2024 EXAM: CT HEAD WITHOUT CONTRAST 06/24/2024 03:45:22 AM TECHNIQUE: CT of the head was performed without the administration of intravenous contrast. Automated exposure  control, iterative reconstruction, and/or weight based adjustment of the mA/kV was utilized to reduce  the radiation dose to as low as reasonably achievable. COMPARISON: 03/30/2014 CLINICAL HISTORY: Neuro deficit, acute, stroke suspected. Stroke, Lt side facial droop, slurred speech; Dr. Aron FINDINGS: BRAIN AND VENTRICLES: No acute hemorrhage. Gray-white differentiation is preserved. No hydrocephalus. No extra-axial collection. No mass effect or midline shift. Chronic ischemic white matter changes. 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 ASPECTS is 10. ORBITS: No acute abnormality. SINUSES: No acute abnormality. SOFT TISSUES AND SKULL: No acute soft tissue abnormality. No skull fracture. IMPRESSION: 1. No acute intracranial abnormality. ASPECTS is 10. 2. 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 3. Findings communicated to Dr. Sal Khaliqdina at 3:49 am on 06/24/24. Electronically signed by: Franky Stanford MD 06/24/2024 03:50 AM EDT RP Workstation: [id number]     Signature  -   Lavada Stank M.D on 06/25/2024 at 9:55 AM   -  To page go to [email or web address]

## 2024-06-25 NOTE — Plan of Care (Signed)
  Problem: Nutrition: Goal: Risk of aspiration will decrease Outcome: Progressing   Problem: Elimination: Goal: Will not experience complications related to urinary retention Outcome: Progressing   Problem: Safety: Goal: Ability to remain free from injury will improve Outcome: Progressing

## 2024-06-25 NOTE — Progress Notes (Addendum)
 Inpatient Rehab Admissions Coordinator:   Met with patient and daughter at bedside. Patient states he wants to go home. Daughter wants to do what he wants. So plan right now is to go home with [facility] services. Daughter, Vina and wife on way to hospital and will discuss as family.  Will await final family discussion regarding discharge plan prior to signing off.       Rehab Admissons Coordinator Fantasy Donald, [place], [place] [phone]

## 2024-06-26 DIAGNOSIS — R29818 Other symptoms and signs involving the nervous system: Secondary | ICD-10-CM | POA: Diagnosis not present

## 2024-06-26 LAB — CBC WITH DIFFERENTIAL/PLATELET
Abs Immature Granulocytes: 0.03 K/uL (ref 0.00–0.07)
Basophils Absolute: 0.1 K/uL (ref 0.0–0.1)
Basophils Relative: 1 %
Eosinophils Absolute: 0.8 K/uL — ABNORMAL HIGH (ref 0.0–0.5)
Eosinophils Relative: 8 %
HCT: 44.1 % (ref 39.0–52.0)
Hemoglobin: 14.7 g/dL (ref 13.0–17.0)
Immature Granulocytes: 0 %
Lymphocytes Relative: 14 %
Lymphs Abs: 1.3 K/uL (ref 0.7–4.0)
MCH: 30.1 pg (ref 26.0–34.0)
MCHC: 33.3 g/dL (ref 30.0–36.0)
MCV: 90.2 fL (ref 80.0–100.0)
Monocytes Absolute: 0.8 K/uL (ref 0.1–1.0)
Monocytes Relative: 8 %
Neutro Abs: 6.5 K/uL (ref 1.7–7.7)
Neutrophils Relative %: 69 %
Platelets: 101 K/uL — ABNORMAL LOW (ref 150–400)
RBC: 4.89 MIL/uL (ref 4.22–5.81)
RDW: 13.9 % (ref 11.5–15.5)
WBC: 9.4 K/uL (ref 4.0–10.5)
nRBC: 0 % (ref 0.0–0.2)

## 2024-06-26 LAB — ABO/RH: ABO/RH(D): A POS

## 2024-06-26 LAB — BASIC METABOLIC PANEL WITH GFR
Anion gap: 10 (ref 5–15)
BUN: 13 mg/dL (ref 8–23)
CO2: 24 mmol/L (ref 22–32)
Calcium: 9.1 mg/dL (ref 8.9–10.3)
Chloride: 104 mmol/L (ref 98–111)
Creatinine, Ser: 0.99 mg/dL (ref 0.61–1.24)
GFR, Estimated: >60 mL/min (ref >60)
Glucose, Bld: 118 mg/dL — ABNORMAL HIGH (ref 70–99)
Potassium: 3.8 mmol/L (ref 3.5–5.1)
Sodium: 138 mmol/L (ref 135–145)

## 2024-06-26 NOTE — TOC CAGE-AID Note (Signed)
 Transition of Care [facility]) - CAGE-AID Screening   Patient Details  Name: Jackson Lawson MRN: [id number] Date of Birth: January 14, 1941  [facility]) CM/SW Contact:    Davon Folta E Pearly Bartosik, LCSW Phone Number: 06/26/2024, 9:36 AM   Clinical Narrative: No SA noted.   CAGE-AID Screening:    Have You Ever Felt You Ought to Cut Down on Your Drinking or Drug Use?: No Have People Annoyed You By Critizing Your Drinking Or Drug Use?: No Have You Felt Bad Or Guilty About Your Drinking Or Drug Use?: No Have You Ever Had a Drink or Used Drugs First Thing In The Morning to Steady Your Nerves or to Get Rid of a Hangover?: No CAGE-AID Score: 0  Substance Abuse Education Offered: No

## 2024-06-26 NOTE — Plan of Care (Signed)
  Problem: Education: Goal: Knowledge of disease or condition will improve Outcome: Progressing Goal: Knowledge of secondary prevention will improve (MUST DOCUMENT ALL) Outcome: Progressing   Problem: Ischemic Stroke/TIA Tissue Perfusion: Goal: Complications of ischemic stroke/TIA will be minimized Outcome: Progressing   Problem: Coping: Goal: Will verbalize positive feelings about self Outcome: Progressing Goal: Will identify appropriate support needs Outcome: Progressing   Problem: Health Behavior/Discharge Planning: Goal: Goals will be collaboratively established with patient/family Outcome: Progressing   Problem: Nutrition: Goal: Risk of aspiration will decrease Outcome: Progressing   Problem: Education: Goal: Knowledge of General Education information will improve Description: Including pain rating scale, medication(s)/side effects and non-pharmacologic comfort measures Outcome: Progressing   Problem: Clinical Measurements: Goal: Diagnostic test results will improve Outcome: Progressing

## 2024-06-26 NOTE — Progress Notes (Signed)
 PROGRESS NOTE                                                                                                                                                                                                             Patient Demographics:    Jackson Lawson, is a 83 y.o. male, DOB - 01/17/1941, FMW:[id number]  Outpatient Primary MD for the patient is Pcp, No    LOS - 2  Admit date - 06/24/2024    Chief Complaint  Patient presents with   Code Stroke       Brief Narrative (HPI from H&P)   83 y.o. male with medical history significant of paroxysmal tribulation, essential hypertension, hyperlipidemia and CAD presented to emergency department with complaining of left-sided weakness, left-sided facial droop and slurred speech started around 02:15 a.m.  Last known well around 7 PM 8/16 last night when the patient went to bed.  Patient reported that he went to bed midnight and woke up middle of the night with sudden onset of above symptoms, his workup suggestive of acute stroke and was admitted to the hospital.   Subjective:   Patient in bed, appears comfortable, denies any headache, no fever, no chest pain or pressure, no shortness of breath , no abdominal pain. No focal weakness., continued left-sided weakness.   Assessment  & Plan :   Acute left-sided hemiparesis and dysarthria.  Due to acute right pontine ischemic infarct, undergoing full stroke workup seen by stroke team, echo pending, PT OT and speech following, CIR recommended, currently on Eliquis  and aspirin , Crestor  added as LDL was above goal, A1c stable.  Continue to monitor.  Family and patient to decide today on disposition as patient refuses CIR but family says they are unable to care for him.  Paroxysmal atrial fibrillation  - Continue Eliquis .     Essential hypertension Permissive hypertension due to #1 above gradually reintroduce blood pressure medications over the  next 1 to 2 days.   Hyperlipidemia History of CAD -Holding home medications as patient failed swallow screen and NPO.   Rheumatoid arthritis -Wife reported that patient is not any medication for rheumatoid arthritis anymore.        Condition - Extremely Guarded  Family Communication  : Discussed with daughter Vina [phone] on 06/26/2024  Code Status : Full code  Consults  : Neurology  PUD Prophylaxis :     Procedures  :     Echocardiogram.  1. Left ventricular ejection fraction, by estimation, is 55 to 60%. The left ventricle has normal function. The left ventricle has no regional wall motion abnormalities. There is mild concentric left ventricular hypertrophy. Left ventricular diastolic parameters are indeterminate.  2. Peak RV-RA gradient 29 mmHg. Right ventricular systolic function is moderately reduced. The right ventricular size is mildly enlarged.  3. Left atrial size was mild to moderately dilated.  4. Right atrial size was mildly dilated.  5. The mitral valve is normal in structure. Trivial mitral valve regurgitation. No evidence of mitral stenosis.  6. The aortic valve is tricuspid. Aortic valve regurgitation is trivial.  7. Aortic dilatation noted. There is mild dilatation of the aortic root, measuring 39 mm. There is mild dilatation of the ascending aorta, measuring 39 mm.  8. IVC not visualized.  9. The patient was in atrial fibrillation  MRI brain, CTA head and neck, MR angiogram head and neck.  Noted.      Disposition Plan  :    Status is: Inpatient   DVT Prophylaxis  :    SCD's Start: 06/24/24 0444 apixaban  (ELIQUIS ) tablet 5 mg     Lab Results  Component Value Date   PLT 101 (L) 06/26/2024    Diet :  Diet Order             DIET DYS 2 Room service appropriate? Yes; Fluid consistency: Honey Thick  Diet effective now                    Inpatient Medications  Scheduled Meds:  apixaban   5 mg Oral BID   aspirin  EC  81 mg Oral Daily    rosuvastatin   10 mg Oral Daily   Continuous Infusions: PRN Meds:.[DISCONTINUED] acetaminophen  **OR** [DISCONTINUED] acetaminophen  (TYLENOL ) oral liquid 160 mg/5 mL **OR** acetaminophen , acetaminophen , labetalol , nitroGLYCERIN , ondansetron  (ZOFRAN ) IV  Antibiotics  :    Anti-infectives (From admission, onward)    None         Objective:   Vitals:   06/25/24 1648 06/25/24 1955 06/25/24 2324 06/26/24 0432  BP:  (!) 158/87 (!) 154/85 (!) 140/95  Pulse:  76 71 69  Resp:  20 18 20   Temp: 98.2 F (36.8 C) 98.4 F (36.9 C) 99.1 F (37.3 C) 98.7 F (37.1 C)  TempSrc: Oral Oral Oral Oral  SpO2:   95% 94%  Weight:      Height:        Wt Readings from Last 3 Encounters:  06/24/24 73.9 kg  09/10/22 70.5 kg  12/03/20 76.6 kg     Intake/Output Summary (Last 24 hours) at 06/26/2024 0935 Last data filed at 06/25/2024 1600 Gross per 24 hour  Intake 480 ml  Output 900 ml  Net -420 ml     Physical Exam  Awake Alert, No new F.N deficits, continues to have mild left-sided weakness and some problems swallowing food and liquids .AT,PERRAL Supple Neck, No JVD,   Symmetrical Chest wall movement, Good air movement bilaterally, CTAB RRR,No Gallops,Rubs or new Murmurs,  +ve B.Sounds, Abd Soft, No tenderness,   No Cyanosis, Clubbing or edema     Data Review:    Recent Labs  Lab 06/24/24 0342 06/24/24 0347 06/24/24 0642 06/25/24 0436 06/26/24 0600  WBC 10.0  --  9.9 8.0 9.4  HGB 14.2 15.6 13.9 12.9* 14.7  HCT 43.6 46.0 42.8 39.1 44.1  PLT 96*  --  89* 88* 101*  MCV 92.2  --  92.4 90.9 90.2  MCH 30.0  --  30.0 30.0 30.1  MCHC 32.6  --  32.5 33.0 33.3  RDW 13.5  --  13.6 13.9 13.9  LYMPHSABS 2.1  --   --  1.4 1.3  MONOABS 0.9  --   --  0.7 0.8  EOSABS 1.0*  --   --  0.4 0.8*  BASOSABS 0.1  --   --  0.0 0.1    Recent Labs  Lab 06/24/24 0342 06/24/24 0347 06/24/24 0642 06/25/24 0436 06/26/24 0600  NA 138 140 139 138 138  K 3.3* 3.2* 3.8 3.3* 3.8  CL 102 104  102 108 104  CO2 23  --  25 25 24   ANIONGAP 13  --  12 5 10   GLUCOSE 136* 135* 149* 119* 118*  BUN 15 18 14 12 13   CREATININE 1.10 1.00 1.07 1.08 0.99  AST 17  --  18  --   --   ALT 10  --  11  --   --   ALKPHOS 59  --  60  --   --   BILITOT 0.4  --  0.6  --   --   ALBUMIN 3.4*  --  3.4*  --   --   INR 1.3*  --   --   --   --   HGBA1C  --   --  6.0*  --   --   CALCIUM  9.1  --  9.1 8.6* 9.1      Recent Labs  Lab 06/24/24 0342 06/24/24 0642 06/25/24 0436 06/26/24 0600  INR 1.3*  --   --   --   HGBA1C  --  6.0*  --   --   CALCIUM  9.1 9.1 8.6* 9.1    --------------------------------------------------------------------------------------------------------------- Lab Results  Component Value Date   CHOL 132 06/24/2024   HDL 42 06/24/2024   LDLCALC 80 06/24/2024   TRIG 48 06/24/2024   CHOLHDL 3.1 06/24/2024    Lab Results  Component Value Date   HGBA1C 6.0 (H) 06/24/2024   No results for input(s): TSH, T4TOTAL, FREET4, T3FREE, THYROIDAB in the last 72 hours. No results for input(s): VITAMINB12, FOLATE, FERRITIN, TIBC, IRON, RETICCTPCT in the last 72 hours. ------------------------------------------------------------------------------------------------------------------ Cardiac Enzymes No results for input(s): CKMB, TROPONINI, MYOGLOBIN in the last 168 hours.  Invalid input(s): CK  Micro Results No results found for this or any previous visit (from the past 240 hours).  Radiology Report ECHOCARDIOGRAM COMPLETE Result Date: 06/25/2024    ECHOCARDIOGRAM REPORT   Patient Name:   DORNELL GRASMICK Huberty Date of Exam: 06/25/2024 Medical Rec #:  [id number]     Height:       74.0 in Accession #:    [id number]    Weight:       162.9 lb Date of Birth:  1941/06/16      BSA:          1.992 m Patient Age:    83 years      BP:           180/102 mmHg Patient Gender: M             HR:           70 bpm. Exam Location:  Inpatient Procedure: 2D Echo (Both Spectral and  Color Flow Doppler were utilized during            procedure). Indications:    stroke  History:        Patient has prior history of Echocardiogram examinations, most                 recent 12/26/2020. CAD, Arrythmias:Atrial Fibrillation; Risk                 Factors:Hypertension and Dyslipidemia.  Sonographer:    Tinnie Barefoot RDCS Referring Phys: [id number] ARY CUMMINS  Sonographer Comments: Suboptimal subcostal window. IMPRESSIONS  1. Left ventricular ejection fraction, by estimation, is 55 to 60%. The left ventricle has normal function. The left ventricle has no regional wall motion abnormalities. There is mild concentric left ventricular hypertrophy. Left ventricular diastolic parameters are indeterminate.  2. Peak RV-RA gradient 29 mmHg. Right ventricular systolic function is moderately reduced. The right ventricular size is mildly enlarged.  3. Left atrial size was mild to moderately dilated.  4. Right atrial size was mildly dilated.  5. The mitral valve is normal in structure. Trivial mitral valve regurgitation. No evidence of mitral stenosis.  6. The aortic valve is tricuspid. Aortic valve regurgitation is trivial.  7. Aortic dilatation noted. There is mild dilatation of the aortic root, measuring 39 mm. There is mild dilatation of the ascending aorta, measuring 39 mm.  8. IVC not visualized.  9. The patient was in atrial fibrillation. FINDINGS  Left Ventricle: Left ventricular ejection fraction, by estimation, is 55 to 60%. The left ventricle has normal function. The left ventricle has no regional wall motion abnormalities. The left ventricular internal cavity size was normal in size. There is  mild concentric left ventricular hypertrophy. Left ventricular diastolic parameters are indeterminate. Right Ventricle: Peak RV-RA gradient 29 mmHg. The right ventricular size is mildly enlarged. No increase in right ventricular wall thickness. Right ventricular systolic function is moderately reduced. Left Atrium:  Left atrial size was mild to moderately dilated. Right Atrium: Right atrial size was mildly dilated. Pericardium: There is no evidence of pericardial effusion. Mitral Valve: The mitral valve is normal in structure. Trivial mitral valve regurgitation. No evidence of mitral valve stenosis. Tricuspid Valve: The tricuspid valve is normal in structure. Tricuspid valve regurgitation is mild. Aortic Valve: The aortic valve is tricuspid. Aortic valve regurgitation is trivial. Pulmonic Valve: The pulmonic valve was normal in structure. Pulmonic valve regurgitation is not visualized. Aorta: Aortic dilatation noted. There is mild dilatation of the aortic root, measuring 39 mm. There is mild dilatation of the ascending aorta, measuring 39 mm. Venous: IVC not visualized. IAS/Shunts: No atrial level shunt detected by color flow Doppler.  LEFT VENTRICLE PLAX 2D LVIDd:         5.10 cm LVIDs:         3.20 cm LV PW:         1.10 cm LV IVS:        1.20 cm LVOT diam:     2.30 cm LV SV:         70 LV SV Index:   35 LVOT Area:     4.15 cm  RIGHT VENTRICLE RV Basal diam:  3.10 cm RV S prime:     12.60 cm/s TAPSE (M-mode): 1.2 cm LEFT ATRIUM             Index        RIGHT ATRIUM           Index LA diam:        4.80 cm 2.41 cm/m   RA Area:     20.90 cm LA Vol (A2C):  82.7 ml 41.51 ml/m  RA Volume:   59.80 ml  30.02 ml/m LA Vol (A4C):   72.0 ml 36.14 ml/m LA Biplane Vol: 77.5 ml 38.90 ml/m  AORTIC VALVE LVOT Vmax:   95.85 cm/s LVOT Vmean:  56.350 cm/s LVOT VTI:    0.169 m  AORTA Ao Root diam: 3.90 cm Ao Asc diam:  3.90 cm TRICUSPID VALVE TR Peak grad:   28.7 mmHg TR Vmax:        268.00 cm/s  SHUNTS Systemic VTI:  0.17 m Systemic Diam: 2.30 cm Dalton McleanMD Electronically signed by Ezra Kanner Signature Date/Time: 06/25/2024/9:10:48 PM    Final    DG Swallowing Func-Speech Pathology Result Date: 06/24/2024 Modified Barium Swallow Study Patient Details Name: ARIC JOST MRN: [id number] Date of Birth: Jun 06, 1941 Today's Date:  06/24/2024 HPI/PMH: HPI: 83 yo male presenting 8/17 with L sided weakness and slurred speech. 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 PMH includes: CAD, HTN, afib, RA. MRI also showed remote infarcts in the thalami (R>L), L caudate head, and L corona radiata. Esophagram in 2006 indicating reflux and hiatal hernia. Clinical Impression: Clinical Impression: Pt has an oropharyngeal dysphagia marked by impaired timing, weakness, and reduced sensation. Labial seal is reduced and lingual propulsion is slow, leading to small amounts of anterior loss as well as oral residue that sometimes spills back toward the pharynx. Thin and nectar thick liquids spill to the pyrifrom sinuses before the swallow and because they do, they are able to spill into the airway because he also has reduced hyolaryngeal movement and laryngeal vestibule closure. Airway protection is improved when boluses are better contained above the valleculae before the swallow begins, which happens with honey thick liquids and solids. Attempted a chin tuck posture which can better contain nectar thick liquids, although not when boluses start to increase in size. Sensation of aspiration is volume dependent (mostly PAS 8; one instance of PAS 7 with thin liquids with chin tuck given larger volume). Recommend starting with Dys 2 (finely chopped) diet and honey thick liquids with ongoing SLP f/u warranted. DIGEST Swallow Severity Rating*             Safety: 3             Efficiency:0             Overall Pharyngeal Swallow Severity: 3 1: mild; 2: moderate; 3: severe; 4: profound *The Dynamic Imaging Grade of Swallowing Toxicity is standardized for the head and neck cancer population, however, demonstrates promising clinical applications across populations to standardize the clinical rating of pharyngeal swallow safety and severity.  Factors that may increase risk of adverse event in presence of aspiration Noe & Lianne 2021): Factors that may  increase risk of adverse event in presence of aspiration Noe & Lianne 2021): Limited mobility; Frail or deconditioned; Dependence for feeding and/or oral hygiene; Weak cough; Aspiration of thick, dense, and/or acidic materials Recommendations/Plan: Swallowing Evaluation Recommendations Swallowing Evaluation Recommendations Recommendations: PO diet PO Diet Recommendation: Dysphagia 2 (Finely chopped); Moderately thick liquids (Level 3, honey thick) Liquid Administration via: Cup; Straw Medication Administration: Whole meds with puree Supervision: Staff to assist with self-feeding; Full supervision/cueing for swallowing strategies Swallowing strategies  : Slow rate; Small bites/sips; Check for pocketing or oral holding; Check for anterior loss Postural changes: Position pt fully upright for meals; Stay upright 30-60 min after meals Oral care recommendations: Oral care BID (2x/day) Treatment Plan Treatment Plan Treatment recommendations: Therapy as outlined in treatment plan below  Follow-up recommendations: Acute inpatient rehab (3 hours/day) Functional status assessment: Patient has had a recent decline in their functional status and demonstrates the ability to make significant improvements in function in a reasonable and predictable amount of time. Treatment frequency: Min 2x/week Treatment duration: 2 weeks Interventions: Aspiration precaution training; Compensatory techniques; Patient/family education; Trials of upgraded texture/liquids; Diet toleration management by SLP Recommendations Recommendations for follow up therapy are one component of a multi-disciplinary discharge planning process, led by the attending physician.  Recommendations may be updated based on patient status, additional functional criteria and insurance authorization. Assessment: Orofacial Exam: Orofacial Exam Oral Cavity - Dentition: Adequate natural dentition; Missing dentition Orofacial Anatomy: WFL Oral Motor/Sensory Function:  Suspected cranial nerve impairment CN VII - Facial: Left motor impairment Anatomy: Anatomy: WFL Boluses Administered: Boluses Administered Boluses Administered: Thin liquids (Level 0); Mildly thick liquids (Level 2, nectar thick); Moderately thick liquids (Level 3, honey thick); Puree; Solid  Oral Impairment Domain: Oral Impairment Domain Lip Closure: Escape progressing to mid-chin Tongue control during bolus hold: Escape to lateral buccal cavity/floor of mouth Bolus preparation/mastication: Slow prolonged chewing/mashing with complete recollection Bolus transport/lingual motion: Slow tongue motion Oral residue: Residue collection on oral structures Location of oral residue : Tongue (spills back to pharynx) Initiation of pharyngeal swallow : Pyriform sinuses  Pharyngeal Impairment Domain: Pharyngeal Impairment Domain Soft palate elevation: No bolus between soft palate (SP)/pharyngeal wall (PW) Laryngeal elevation: Partial superior movement of thyroid  cartilage/partial approximation of arytenoids to epiglottic petiole Anterior hyoid excursion: Partial anterior movement Epiglottic movement: Partial inversion Laryngeal vestibule closure: Incomplete, narrow column air/contrast in laryngeal vestibule Pharyngeal stripping wave : Present - complete Pharyngeal contraction (A/P view only): N/A Pharyngoesophageal segment opening: Complete distension and complete duration, no obstruction of flow Tongue base retraction: No contrast between tongue base and posterior pharyngeal wall (PPW) Pharyngeal residue: Trace residue within or on pharyngeal structures Location of pharyngeal residue: Pyriform sinuses  Esophageal Impairment Domain: Esophageal Impairment Domain Esophageal clearance upright position: Esophageal retention Pill: Pill Consistency administered: Mildly thick liquids (Level 2, nectar thick) Mildly thick liquids (Level 2, nectar thick): Impaired (see clinical impressions) Penetration/Aspiration Scale Score:  Penetration/Aspiration Scale Score 1.  Material does not enter airway: Puree; Solid; Pill 8.  Material enters airway, passes BELOW cords without attempt by patient to eject out (silent aspiration) : Thin liquids (Level 0); Mildly thick liquids (Level 2, nectar thick) Compensatory Strategies: Compensatory Strategies Compensatory strategies: Yes Straw: Ineffective Ineffective Straw: Thin liquid (Level 0); Mildly thick liquid (Level 2, nectar thick) Chin tuck: Ineffective Ineffective Chin Tuck: Thin liquid (Level 0); Mildly thick liquid (Level 2, nectar thick)   General Information: Caregiver present: No  Diet Prior to this Study: NPO   Temperature : Normal   Respiratory Status: WFL   Supplemental O2: None (Room air)   History of Recent Intubation: No  Behavior/Cognition: Alert; Cooperative; Pleasant mood Self-Feeding Abilities: Dependent for feeding Baseline vocal quality/speech: Normal Volitional Cough: Able to elicit Volitional Swallow: Able to elicit Exam Limitations: No limitations Goal Planning: Prognosis for improved oropharyngeal function: Good No data recorded No data recorded Patient/Family Stated Goal: none stated Consulted and agree with results and recommendations: Patient Pain: Pain Assessment Pain Assessment: Faces Faces Pain Scale: 0 Pain Location: generalized Pain Descriptors / Indicators: Discomfort Pain Intervention(s): Monitored during session End of Session: Start Time:SLP Start Time (ACUTE ONLY): 1140 Stop Time: SLP Stop Time (ACUTE ONLY): 1157 Time Calculation:SLP Time Calculation (min) (ACUTE ONLY): 17 min Charges: SLP Evaluations $ SLP Speech Visit: 1 Visit  SLP Evaluations $BSS Swallow: 1 Procedure $MBS Swallow: 1 Procedure $ SLP EVAL LANGUAGE/SOUND PRODUCTION: 1 Procedure SLP visit diagnosis: SLP Visit Diagnosis: Dysphagia, oropharyngeal phase (R13.12) Past Medical History: No past medical history on file. Past Surgical History: No past surgical history on file. Leita SQUIBB Nix 06/24/2024, 1:34  PM    Signature  -   Lavada Stank M.D on 06/26/2024 at 9:35 AM   -  To page go to [email or web address]

## 2024-06-26 NOTE — TOC Progression Note (Signed)
 Transition of Care [facility]) - Progression Note    Patient Details  Name: Jackson Lawson MRN: [id number] Date of Birth: [birth year]  [facility] CM/SW Contact  Marval Gell, RN Phone Number: 06/26/2024, 2:48 PM  Clinical Narrative:     Spoke w patient and wife at bedside again this afternoon. Patient continues to decline CIR. They are agreeable for a DC to home. He will stay at daughter's house [address] [place] [address] They would like w WC.  Notified Amy w [facility] that patient will DC tomorrow, and I have ordered a WC through [employer] to be delivered to the patient's room  Expected Discharge Plan: Home/Self Care Barriers to Discharge: Continued Medical Work up               Expected Discharge Plan and Services In-house Referral: Clinical Social Work Discharge Planning Services: CM Consult   Living arrangements for the past 2 months: Single Family Home                 DME Arranged: Community education officer wheelchair with seat cushion DME Agency: [employer] Date DME Agency Contacted: 06/26/24 Time DME Agency Contacted: [phone] Representative spoke with at DME Agency: [place] CHEADLE Agency: [facility] Date [facility] Agency Contacted: 06/26/24 Time HH Agency Contacted: [phone] Representative spoke with at [facility] Agency: Amy   Social Drivers of Health (SDOH) Interventions SDOH Screenings   Food Insecurity: No Food Insecurity (06/20/2024)   Received from [facility]  Transportation Needs: No Transportation Needs (06/20/2024)   Received from [facility]  Utilities: Low Risk  (06/20/2024)   Received from [facility]  Financial Resource Strain: Low Risk  (06/21/2023)   [facility]  Physical Activity: Inactive (06/21/2023)   Received from [facility]  Social Connections: Moderately Isolated (06/21/2023)   Received from [facility]  Stress: No Stress Concern Present (06/21/2023)   Received from [facility]  Tobacco Use: Low  Risk  (06/24/2024)  Health Literacy: Low Risk  (06/21/2023)   Received from [facility]    Readmission Risk Interventions     No data to display

## 2024-06-26 NOTE — Progress Notes (Signed)
 Physical Therapy Treatment Patient Details Name: Jackson Lawson MRN: [id number] DOB: 1941/08/08 Today's Date: 06/26/2024   History of Present Illness Jackson Lawson is a 83 y.o. male who presented to [facility] ED 06/24/24 as a code stroke. 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 PMH: RA, CAD, HLD, HTN, afib, mitral regurgitation, aortic insufficiency    Jackson Lawson Comments  Jackson Lawson with fair tolerance to treatment today. +2 Max A to stand and transfer to and from [facility]. Jackson Lawson noted with very ataxic gait and heavy L lateral lean. Jackson Lawson continues to adamantly decline CIR and Jackson Lawson was extensively educated on need for skilled rehab given current mobility status however Jackson Lawson still continued to decline stating I'll be alright, if something happens I'll just live with it. Jackson Lawson spouse present and educated on safe transfers to and from [facility] as well as given a gait belt. Jackson Lawson will likely need RW, hoyer lift, hospital bed, and WC upon DC. No change in DC/DME recs at this time. Jackson Lawson will continue to follow. Jackson Lawson anticipates DC home tomorrow.    If plan is discharge home, recommend the following: Two people to help with walking and/or transfers;A lot of help with bathing/dressing/bathroom;Assistance with cooking/housework;Assist for transportation;Help with stairs or ramp for entrance   Can travel by private vehicle        Equipment Recommendations  [place] lift;Wheelchair (measurements Jackson Lawson);Rolling walker (2 wheels);Wheelchair cushion (measurements Jackson Lawson);Hospital bed    Recommendations for Other Services       Precautions / Restrictions Precautions Precautions: Fall Recall of Precautions/Restrictions: Impaired Precaution/Restrictions Comments: SBP goal: permissive hypertension first 24 h < 220/110. Restrictions Weight Bearing Restrictions Per Provider Order: No     Mobility  Bed Mobility Overal bed mobility: Needs Assistance Bed Mobility: Supine to Sit, Sit to  Supine     Supine to sit: +2 for physical assistance, Mod assist Sit to supine: +2 for physical assistance, Mod assist   General bed mobility comments: +2 Mod A to scoot hips to EOB. Assistance for BLE management to return to bed.    Transfers Overall transfer level: Needs assistance Equipment used: Rolling walker (2 wheels) Transfers: Sit to/from Stand, Bed to chair/wheelchair/BSC Sit to Stand: +2 physical assistance, Max assist   Step pivot transfers: +2 physical assistance, Max assist       General transfer comment: +2 Max A to stand and transfer to and from [facility]. Jackson Lawson noted with very ataxic gait and heavy L lateral lean.    Ambulation/Gait Ambulation/Gait assistance: +2 physical assistance, Max assist Gait Distance (Feet): 5 Feet Assistive device: Rolling walker (2 wheels) Gait Pattern/deviations: Ataxic, Leaning posteriorly, Wide base of support, Knees buckling, Decreased stride length, Step-through pattern Gait velocity: decreased     General Gait Details: Jackson Lawson able to ambulate very short distance with extremely ataxic gait pattern. Heavy L lateral and posterior lean with ambulation and transfers.   Stairs             Wheelchair Mobility     Tilt Bed    Modified Rankin (Stroke Patients Only)       Balance Overall balance assessment: Needs assistance Sitting-balance support: No upper extremity supported, Feet supported, Bilateral upper extremity supported Sitting balance-Leahy Scale: Poor Sitting balance - Comments: min-mod A at EOB; brief bout of CGA Postural control: Left lateral lean, Posterior lean Standing balance support: Bilateral upper extremity supported, During functional activity Standing balance-Leahy Scale: Zero Standing balance comment: poor to 0; needs  2 person assist for static standing                            Communication Communication Communication: Impaired Factors Affecting Communication: Reduced clarity of  speech;Difficulty expressing self  Cognition Arousal: Alert Behavior During Therapy: Flat affect   Jackson Lawson - Cognitive impairments: Initiation, Sequencing, Problem solving, Safety/Judgement                       Jackson Lawson - Cognition Comments: Jackson Lawson A,Ox4. He demonstrated decreased processing speed. Following commands: Impaired Following commands impaired: Follows one step commands with increased time, Follows multi-step commands inconsistently    Cueing Cueing Techniques: Verbal cues, Gestural cues, Tactile cues  Exercises      General Comments General comments (skin integrity, edema, etc.): Jackson Lawson noted to be in afib however HR stayed in the 80s and 90s.      Pertinent Vitals/Pain Pain Assessment Pain Assessment: No/denies pain    Home Living                          Prior Function            Jackson Lawson Goals (current goals can now be found in the care plan section) Progress towards Jackson Lawson goals: Progressing toward goals    Frequency    Min 3X/week      Jackson Lawson Plan      Co-evaluation              AM-PAC Jackson Lawson 6 Clicks Mobility   Outcome Measure  Help needed turning from your back to your side while in a flat bed without using bedrails?: A Lot Help needed moving from lying on your back to sitting on the side of a flat bed without using bedrails?: A Lot Help needed moving to and from a bed to a chair (including a wheelchair)?: A Lot Help needed standing up from a chair using your arms (e.g., wheelchair or bedside chair)?: A Lot Help needed to walk in hospital room?: Total Help needed climbing 3-5 steps with a railing? : Total 6 Click Score: 10    End of Session Equipment Utilized During Treatment: Gait belt Activity Tolerance: Patient tolerated treatment well;Patient limited by fatigue Patient left: in bed;with call bell/phone within reach;with family/visitor present Nurse Communication: Mobility status Jackson Lawson Visit Diagnosis: Hemiplegia and hemiparesis;Difficulty in  walking, not elsewhere classified (R26.2);Other abnormalities of gait and mobility (R26.89) Hemiplegia - Right/Left: Left Hemiplegia - dominant/non-dominant: Non-dominant Hemiplegia - caused by: Cerebral infarction     Time: 1355-1416 Jackson Lawson Time Calculation (min) (ACUTE ONLY): 21 min  Charges:    $Therapeutic Activity: 8-22 mins Jackson Lawson General Charges $$ ACUTE Jackson Lawson VISIT: 1 Visit                     Jackson Lawson, Jackson Lawson, Jackson Lawson Acute Rehab Services [phone]    Jackson Lawson 06/26/2024, 3:19 PM

## 2024-06-26 NOTE — Progress Notes (Signed)
   Inpatient Rehabilitation Admissions Coordinator   I met with, patient, wife, daughter and son in law at bedside. Patient is adamant to go home and not CIR. Daughter states he will go home to her house and doctor spoke with her today about sending him home tomorrow. I explained to son in law that I can not admit him to CIR against his will for he has to do the work. I have alerted acute team and TOC.  Heron Leavell, RN, MSN Rehab Admissions Coordinator [phone] 06/26/2024 11:48 AM

## 2024-06-27 ENCOUNTER — Other Ambulatory Visit (HOSPITAL_COMMUNITY): Payer: Self-pay

## 2024-06-27 DIAGNOSIS — I6320 Cerebral infarction due to unspecified occlusion or stenosis of unspecified precerebral arteries: Secondary | ICD-10-CM | POA: Diagnosis not present

## 2024-06-27 DIAGNOSIS — R29818 Other symptoms and signs involving the nervous system: Secondary | ICD-10-CM | POA: Diagnosis not present

## 2024-06-27 MED ORDER — ASPIRIN 81 MG PO TBEC
81.0000 mg | DELAYED_RELEASE_TABLET | Freq: Every day | ORAL | 0 refills | Status: DC
  Filled 2024-06-27: qty 30, 30d supply, fill #0

## 2024-06-27 MED ORDER — ROSUVASTATIN CALCIUM 10 MG PO TABS
10.0000 mg | ORAL_TABLET | Freq: Every day | ORAL | 0 refills | Status: DC
  Filled 2024-06-27: qty 30, 30d supply, fill #0

## 2024-06-27 MED ORDER — PANTOPRAZOLE SODIUM 40 MG PO TBEC
40.0000 mg | DELAYED_RELEASE_TABLET | Freq: Every day | ORAL | Status: DC
  Administered 2024-06-27: 40 mg via ORAL
  Filled 2024-06-27: qty 1

## 2024-06-27 MED ORDER — ACETAMINOPHEN 325 MG PO TABS: 650.0000 mg | ORAL_TABLET | Freq: Four times a day (QID) | ORAL | Status: DC | PRN

## 2024-06-27 NOTE — Progress Notes (Signed)
 Occupational Therapy Treatment Patient Details Name: Jackson Lawson MRN: [id number] DOB: [birth year] Today's Date: 06/27/2024   History of present illness Jackson Lawson is a 83 y.o. male who presented to [facility] ED 06/24/24 as a code stroke. 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 PMH: RA, CAD, HLD, HTN, afib, mitral regurgitation, aortic insufficiency   OT comments  Per chart, pt prefers to go home rather than rehab. OT continues to highly endorse rehab due to significant functional decline, good support, and rehab potential. However, focus session on pt/family education in preparation for home. Worked on SPT transfers with provision of pericare before pivot as pt soiled on arrival. Transitioned into education for optimizing safety with mobility and prevention of comorbidity such as wounds due to immobility. Pt and family educated and demonstrating squat pivot transfers toward stronger R side, pressure relief, use of gait belt, car transfers, wheelchair brakes/leg rests/mobility. Will continue to follow acutely. If pt continues to defer inpatient rehab recommend HHOT.       If plan is discharge home, recommend the following:  Two people to help with walking and/or transfers;Two people to help with bathing/dressing/bathroom;Assistance with cooking/housework;Direct supervision/assist for medications management;Direct supervision/assist for financial management;Assist for transportation;Help with stairs or ramp for entrance   Equipment Recommendations  Wheelchair cushion (measurements OT);Wheelchair (measurements OT);Other (comment) (RW, drop arm BSC; may also benefit from hoyer and hospital bed)    Recommendations for Other Services Rehab consult;Speech consult    Precautions / Restrictions Precautions Precautions: Fall Recall of Precautions/Restrictions: Impaired       Mobility Bed Mobility Overal bed mobility:  Needs Assistance Bed Mobility: Supine to Sit     Supine to sit: Min assist     General bed mobility comments: HHA for trunk    Transfers Overall transfer level: Needs assistance Equipment used: 2 person hand held assist Transfers: Sit to/from Stand, Bed to chair/wheelchair/BSC Sit to Stand: Mod assist, +2 physical assistance, +2 safety/equipment (L knee block initially)   Squat pivot transfers: Min assist (min A for guidance, cues for UE and foot placement/weight shift after initial education) Step pivot transfers: Mod assist, +2 safety/equipment, +2 physical assistance     General transfer comment: provided family education regarding movement toward strong R side; worked on SPT, however, provided education and practiced squat pivot as this will be safest option for pt at home initally     Balance Overall balance assessment: Needs assistance Sitting-balance support: No upper extremity supported, Feet supported, Bilateral upper extremity supported Sitting balance-Leahy Scale: Fair Sitting balance - Comments: statically   Standing balance support: Bilateral upper extremity supported, During functional activity Standing balance-Leahy Scale: Poor                             ADL either performed or assessed with clinical judgement   ADL Overall ADL's : Needs assistance/impaired                         Toilet Transfer: Moderate assistance;+2 for physical assistance;+2 for safety/equipment;Stand-pivot;Minimal assistance;Squat-pivot                  Extremity/Trunk Assessment Upper Extremity Assessment Upper Extremity Assessment: Right hand dominant;LUE deficits/detail LUE Deficits / Details: generally weak, 3-/5 strength   Lower Extremity Assessment Lower Extremity Assessment: Defer to PT evaluation        Vision  Perception     Praxis     Communication Communication Communication: Impaired Factors Affecting Communication: Reduced  clarity of speech;Difficulty expressing self (improving)   Cognition Arousal: Alert Behavior During Therapy: Flat affect Cognition: Cognition impaired     Awareness: Intellectual awareness intact, Online awareness impaired Memory impairment (select all impairments): Short-term memory Attention impairment (select first level of impairment): Selective attention Executive functioning impairment (select all impairments): Sequencing, Problem solving, Reasoning OT - Cognition Comments: pt with fair insight into deficits, more motivated to move today.                 Following commands: Impaired Following commands impaired: Follows one step commands with increased time, Follows multi-step commands inconsistently      Cueing   Cueing Techniques: Verbal cues, Gestural cues, Tactile cues  Exercises      Shoulder Instructions       General Comments provided family education regarding pressure relief from bed position, chair position; safety with transfers/recommendations for safer transfers with working on standing and walking with therapies; provision of asssist for LB ADL; wheelcahir use and management    Pertinent Vitals/ Pain       Pain Assessment Pain Assessment: No/denies pain  Home Living                                          Prior Functioning/Environment              Frequency  Min 2X/week        Progress Toward Goals  OT Goals(current goals can now be found in the care plan section)  Progress towards OT goals: Progressing toward goals  Acute Rehab OT Goals Patient Stated Goal: get better OT Goal Formulation: With patient Time For Goal Achievement: 07/08/24 Potential to Achieve Goals: Good ADL Goals Pt Will Perform Grooming: with set-up;sitting Pt Will Perform Upper Body Dressing: with set-up;sitting Pt Will Perform Lower Body Dressing: with mod assist;sit to/from stand Pt Will Transfer to Toilet: with min assist;stand pivot  transfer Pt/caregiver will Perform Home Exercise Program: Left upper extremity;Increased strength;Increased ROM;With written HEP provided  Plan      Co-evaluation    PT/OT/SLP Co-Evaluation/Treatment: Yes Reason for Co-Treatment: Complexity of the patient's impairments (multi-system involvement);For patient/therapist safety;To address functional/ADL transfers PT goals addressed during session: Mobility/safety with mobility OT goals addressed during session: ADL's and self-care;Proper use of Adaptive equipment and DME      AM-PAC OT 6 Clicks Daily Activity     Outcome Measure   Help from another person eating meals?: A Little Help from another person taking care of personal grooming?: A Little Help from another person toileting, which includes using toliet, bedpan, or urinal?: Total Help from another person bathing (including washing, rinsing, drying)?: A Lot Help from another person to put on and taking off regular upper body clothing?: A Lot Help from another person to put on and taking off regular lower body clothing?: Total 6 Click Score: 12    End of Session Equipment Utilized During Treatment: Gait belt;Other (comment) (wheelchair)  OT Visit Diagnosis: Unsteadiness on feet (R26.81);Muscle weakness (generalized) (M62.81);History of falling (Z91.81);Other symptoms and signs involving cognitive function   Activity Tolerance Patient tolerated treatment well   Patient Left in bed;with call bell/phone within reach;with family/visitor present   Nurse Communication Mobility status;Other (comment) (pt soiled with dried stool on bottom on OT arrival,)  Time: 8853-8783 OT Time Calculation (min): 30 min  Charges: OT General Charges $OT Visit: 1 Visit OT Treatments $Self Care/Home Management : 8-22 mins  Elma JONETTA Lebron FREDERICK, OTR/L [facility] Acute Rehabilitation Office: [phone]   Elma JONETTA Lebron 06/27/2024, 1:17 PM

## 2024-06-27 NOTE — Care Management Important Message (Signed)
 Important Message  Patient Details  Name: Jackson Lawson MRN: [id number] Date of Birth: 04-01-1941   Important Message Given:  Yes - Medicare IM     Claretta Deed 06/27/2024, 3:25 PM

## 2024-06-27 NOTE — Plan of Care (Signed)
  Problem: Education: Goal: Knowledge of disease or condition will improve Outcome: Progressing Goal: Knowledge of secondary prevention will improve (MUST DOCUMENT ALL) Outcome: Progressing   Problem: Ischemic Stroke/TIA Tissue Perfusion: Goal: Complications of ischemic stroke/TIA will be minimized Outcome: Progressing   Problem: Coping: Goal: Will verbalize positive feelings about self Outcome: Progressing Goal: Will identify appropriate support needs Outcome: Progressing   Problem: Self-Care: Goal: Ability to participate in self-care as condition permits will improve Outcome: Progressing Goal: Verbalization of feelings and concerns over difficulty with self-care will improve Outcome: Progressing Goal: Ability to communicate needs accurately will improve Outcome: Progressing

## 2024-06-27 NOTE — Discharge Summary (Signed)
 Physician Discharge Summary  Jackson Lawson FMW:[id number] DOB: August 14, 1941 DOA: 06/24/2024  PCP: Pcp, No  Admit date: 06/24/2024 Discharge date: 06/27/2024  Admitted From: (Home) Disposition:  (Home with maximum home health support will be provided as patient adamantly refusing CIR or subacute rehab placement)   Recommendations for Outpatient Follow-up:  Follow up with PCP in 1-2 weeks Please obtain BMP/CBC in one week Neurology follow-up as an outpatient  Home Health (YES)  Diet recommendation: Dysphagia 2 with honey thick, HH ST to trial nectar/advance and D3(mech soft) during therapy and repeat MBS prior to advancing him fully   Brief/Interim Summary:  83 y.o. male with medical history significant of paroxysmal tribulation, essential hypertension, hyperlipidemia and CAD presented to emergency department with complaining of left-sided weakness, left-sided facial droop and slurred speech started around 02:15 a.m.  Last known well around 7 PM 8/16 last night when the patient went to bed.  Patient reported that he went to bed midnight and woke up middle of the night with sudden onset of above symptoms, his workup suggestive of acute stroke and was admitted to the hospital.    Acute left-sided hemiparesis and dysarthria. Acute CVA -  acute right pontine ischemic infarct, neurology input greatly appreciated, etiology likely due to small vessel disease, he was started on aspirin  81 mg oral daily, continue with home Eliquis  as well  - Crestor  added as LDL was above goal,  -A1c stable.  Continue to monitor.   -Patient was seen by PT, OT, and SLP, recommendation for CIR but he declined adamant about going home, so plan to go home with maximum home support .  paroxysmal atrial fibrillation  - Continue Eliquis .     Essential hypertension Permissive hypertension due to above, resuming antihypertensive medications, to resume Norvasc on discharge, and to resume Hyzaar as an outpatient if blood  pressure remains elevated   Hyperlipidemia History of CAD -LDL is 80, goal is less than 70, so he was started on Crestor   Dysphagia -Improving, he is on dysphagia 2 with honey thick, he was seen by SLP prior to discharge, recommendation to continue current diet and HH ST to trial nectar/advance and D3(mech soft) during therapy and repeat MBS prior to advancing him fully    Rheumatoid arthritis -Wife reported that patient is not any medication for rheumatoid arthritis anymore.     Discharge Diagnoses:  Principal Problem:   Neurological deficit present Active Problems:   Slurred speech   Essential hypertension   Hyperlipidemia   Left-sided weakness   Left-sided facial droop   History of CAD (coronary artery disease)   History of rheumatoid arthritis   Right pontine stroke [facility])    Discharge Instructions  Discharge Instructions     Ambulatory referral to Neurology   Complete by: As directed    Follow up with stroke clinic NP at [facility] in about 4-6 weeks. Thanks.   Diet - low sodium heart healthy   Complete by: As directed    Increase activity slowly   Complete by: As directed       Allergies as of 06/27/2024       Reactions   Fenofibrate Diarrhea   Simvastatin Diarrhea        Medication List     STOP taking these medications    losartan-hydrochlorothiazide 100-25 MG tablet Commonly known as: HYZAAR       TAKE these medications    acetaminophen  325 MG tablet Commonly known as: TYLENOL  Take 2 tablets (650 mg total) by  mouth every 6 (six) hours as needed for mild pain (pain score 1-3). What changed:  medication strength how much to take when to take this reasons to take this   amLODipine 10 MG tablet Commonly known as: NORVASC Take 10 mg by mouth daily.   aspirin  EC 81 MG tablet Take 1 tablet (81 mg total) by mouth daily. Swallow whole.   Eliquis  5 MG Tabs tablet Generic drug: apixaban  Take 5 mg by mouth 2 (two) times daily.   FLUoxetine  20  MG capsule Commonly known as: PROZAC  Take 20 mg by mouth daily.   Multi-Vitamin tablet Take 1 tablet by mouth daily.   nitroGLYCERIN  0.4 MG SL tablet Commonly known as: NITROSTAT  Place 0.4 mg under the tongue every 5 (five) minutes as needed for chest pain.   omeprazole 40 MG capsule Commonly known as: PRILOSEC Take 40 mg by mouth daily.   rosuvastatin  10 MG tablet Commonly known as: CRESTOR  Take 1 tablet (10 mg total) by mouth daily.               Durable Medical Equipment  (From admission, onward)           Start     Ordered   06/26/24 1447  For home use only DME lightweight manual wheelchair with seat cushion  Once       Comments: Patient suffers from weakness which impairs their ability to perform daily activities like bathing in the home.  A cane will not resolve  issue with performing activities of daily living. A wheelchair will allow patient to safely perform daily activities. Patient is not able to propel themselves in the home using a standard weight wheelchair due to general weakness. Patient can self propel in the lightweight wheelchair. Length of need Lifetime. Accessories: elevating leg rests (ELRs), wheel locks, extensions and anti-tippers.   06/26/24 1447            Follow-up Information     [facility]. Schedule an appointment as soon as possible for a visit in 1 month(s).   Specialty: Neurology Why: stroke clinic Contact information: [address] [place]  [phone]        [facility] Follow up.   Why: Physical therapy and Occupational therapy. Office will follow up after hospital discharge. Contact information: [address] [place] [address] [phone]                Allergies  Allergen Reactions   Fenofibrate Diarrhea   Simvastatin Diarrhea    Consultations: Neurology   Procedures/Studies: ECHOCARDIOGRAM  COMPLETE Result Date: 06/25/2024    ECHOCARDIOGRAM REPORT   Patient Name:   [name] Date of Exam: 06/25/2024 Medical Rec #:  [id number]     Height:       74.0 in Accession #:    [id number]    Weight:       162.9 lb Date of Birth:  1941/09/15      BSA:          1.992 m Patient Age:    83 years      BP:           180/102 mmHg Patient Gender: M             HR:           70 bpm. Exam Location:  Inpatient Procedure: 2D Echo (Both Spectral and Color Flow Doppler were utilized during  procedure). Indications:    stroke  History:        Patient has prior history of Echocardiogram examinations, most                 recent 12/26/2020. CAD, Arrythmias:Atrial Fibrillation; Risk                 Factors:Hypertension and Dyslipidemia.  Sonographer:    Tinnie Barefoot RDCS Referring Phys: [id number] ARY CUMMINS  Sonographer Comments: Suboptimal subcostal window. IMPRESSIONS  1. Left ventricular ejection fraction, by estimation, is 55 to 60%. The left ventricle has normal function. The left ventricle has no regional wall motion abnormalities. There is mild concentric left ventricular hypertrophy. Left ventricular diastolic parameters are indeterminate.  2. Peak RV-RA gradient 29 mmHg. Right ventricular systolic function is moderately reduced. The right ventricular size is mildly enlarged.  3. Left atrial size was mild to moderately dilated.  4. Right atrial size was mildly dilated.  5. The mitral valve is normal in structure. Trivial mitral valve regurgitation. No evidence of mitral stenosis.  6. The aortic valve is tricuspid. Aortic valve regurgitation is trivial.  7. Aortic dilatation noted. There is mild dilatation of the aortic root, measuring 39 mm. There is mild dilatation of the ascending aorta, measuring 39 mm.  8. IVC not visualized.  9. The patient was in atrial fibrillation. FINDINGS  Left Ventricle: Left ventricular ejection fraction, by estimation, is 55 to 60%. The left ventricle has normal function. The  left ventricle has no regional wall motion abnormalities. The left ventricular internal cavity size was normal in size. There is  mild concentric left ventricular hypertrophy. Left ventricular diastolic parameters are indeterminate. Right Ventricle: Peak RV-RA gradient 29 mmHg. The right ventricular size is mildly enlarged. No increase in right ventricular wall thickness. Right ventricular systolic function is moderately reduced. Left Atrium: Left atrial size was mild to moderately dilated. Right Atrium: Right atrial size was mildly dilated. Pericardium: There is no evidence of pericardial effusion. Mitral Valve: The mitral valve is normal in structure. Trivial mitral valve regurgitation. No evidence of mitral valve stenosis. Tricuspid Valve: The tricuspid valve is normal in structure. Tricuspid valve regurgitation is mild. Aortic Valve: The aortic valve is tricuspid. Aortic valve regurgitation is trivial. Pulmonic Valve: The pulmonic valve was normal in structure. Pulmonic valve regurgitation is not visualized. Aorta: Aortic dilatation noted. There is mild dilatation of the aortic root, measuring 39 mm. There is mild dilatation of the ascending aorta, measuring 39 mm. Venous: IVC not visualized. IAS/Shunts: No atrial level shunt detected by color flow Doppler.  LEFT VENTRICLE PLAX 2D LVIDd:         5.10 cm LVIDs:         3.20 cm LV PW:         1.10 cm LV IVS:        1.20 cm LVOT diam:     2.30 cm LV SV:         70 LV SV Index:   35 LVOT Area:     4.15 cm  RIGHT VENTRICLE RV Basal diam:  3.10 cm RV S prime:     12.60 cm/s TAPSE (M-mode): 1.2 cm LEFT ATRIUM             Index        RIGHT ATRIUM           Index LA diam:        4.80 cm 2.41 cm/m   RA Area:  20.90 cm LA Vol (A2C):   82.7 ml 41.51 ml/m  RA Volume:   59.80 ml  30.02 ml/m LA Vol (A4C):   72.0 ml 36.14 ml/m LA Biplane Vol: 77.5 ml 38.90 ml/m  AORTIC VALVE LVOT Vmax:   95.85 cm/s LVOT Vmean:  56.350 cm/s LVOT VTI:    0.169 m  AORTA Ao Root diam:  3.90 cm Ao Asc diam:  3.90 cm TRICUSPID VALVE TR Peak grad:   28.7 mmHg TR Vmax:        268.00 cm/s  SHUNTS Systemic VTI:  0.17 m Systemic Diam: 2.30 cm Dalton McleanMD Electronically signed by Ezra Kanner Signature Date/Time: 06/25/2024/9:10:48 PM    Final    DG Swallowing Func-Speech Pathology Result Date: 06/24/2024 Modified Barium Swallow Study Patient Details Name: MAXI RODAS MRN: [id number] Date of Birth: 11/14/1940 Today's Date: 06/24/2024 HPI/PMH: HPI: 83 yo male presenting 8/17 with L sided weakness and slurred speech. 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 PMH includes: CAD, HTN, afib, RA. MRI also showed remote infarcts in the thalami (R>L), L caudate head, and L corona radiata. Esophagram in 2006 indicating reflux and hiatal hernia. Clinical Impression: Clinical Impression: Pt has an oropharyngeal dysphagia marked by impaired timing, weakness, and reduced sensation. Labial seal is reduced and lingual propulsion is slow, leading to small amounts of anterior loss as well as oral residue that sometimes spills back toward the pharynx. Thin and nectar thick liquids spill to the pyrifrom sinuses before the swallow and because they do, they are able to spill into the airway because he also has reduced hyolaryngeal movement and laryngeal vestibule closure. Airway protection is improved when boluses are better contained above the valleculae before the swallow begins, which happens with honey thick liquids and solids. Attempted a chin tuck posture which can better contain nectar thick liquids, although not when boluses start to increase in size. Sensation of aspiration is volume dependent (mostly PAS 8; one instance of PAS 7 with thin liquids with chin tuck given larger volume). Recommend starting with Dys 2 (finely chopped) diet and honey thick liquids with ongoing SLP f/u warranted. DIGEST Swallow Severity Rating*             Safety: 3             Efficiency:0             Overall  Pharyngeal Swallow Severity: 3 1: mild; 2: moderate; 3: severe; 4: profound *The Dynamic Imaging Grade of Swallowing Toxicity is standardized for the head and neck cancer population, however, demonstrates promising clinical applications across populations to standardize the clinical rating of pharyngeal swallow safety and severity.  Factors that may increase risk of adverse event in presence of aspiration Noe & Lianne 2021): Factors that may increase risk of adverse event in presence of aspiration Noe & Lianne 2021): Limited mobility; Frail or deconditioned; Dependence for feeding and/or oral hygiene; Weak cough; Aspiration of thick, dense, and/or acidic materials Recommendations/Plan: Swallowing Evaluation Recommendations Swallowing Evaluation Recommendations Recommendations: PO diet PO Diet Recommendation: Dysphagia 2 (Finely chopped); Moderately thick liquids (Level 3, honey thick) Liquid Administration via: Cup; Straw Medication Administration: Whole meds with puree Supervision: Staff to assist with self-feeding; Full supervision/cueing for swallowing strategies Swallowing strategies  : Slow rate; Small bites/sips; Check for pocketing or oral holding; Check for anterior loss Postural changes: Position pt fully upright for meals; Stay upright 30-60 min after meals Oral care recommendations: Oral care BID (2x/day) Treatment Plan Treatment Plan Treatment recommendations:  Therapy as outlined in treatment plan below Follow-up recommendations: Acute inpatient rehab (3 hours/day) Functional status assessment: Patient has had a recent decline in their functional status and demonstrates the ability to make significant improvements in function in a reasonable and predictable amount of time. Treatment frequency: Min 2x/week Treatment duration: 2 weeks Interventions: Aspiration precaution training; Compensatory techniques; Patient/family education; Trials of upgraded texture/liquids; Diet toleration management by  SLP Recommendations Recommendations for follow up therapy are one component of a multi-disciplinary discharge planning process, led by the attending physician.  Recommendations may be updated based on patient status, additional functional criteria and insurance authorization. Assessment: Orofacial Exam: Orofacial Exam Oral Cavity - Dentition: Adequate natural dentition; Missing dentition Orofacial Anatomy: WFL Oral Motor/Sensory Function: Suspected cranial nerve impairment CN VII - Facial: Left motor impairment Anatomy: Anatomy: WFL Boluses Administered: Boluses Administered Boluses Administered: Thin liquids (Level 0); Mildly thick liquids (Level 2, nectar thick); Moderately thick liquids (Level 3, honey thick); Puree; Solid  Oral Impairment Domain: Oral Impairment Domain Lip Closure: Escape progressing to mid-chin Tongue control during bolus hold: Escape to lateral buccal cavity/floor of mouth Bolus preparation/mastication: Slow prolonged chewing/mashing with complete recollection Bolus transport/lingual motion: Slow tongue motion Oral residue: Residue collection on oral structures Location of oral residue : Tongue (spills back to pharynx) Initiation of pharyngeal swallow : Pyriform sinuses  Pharyngeal Impairment Domain: Pharyngeal Impairment Domain Soft palate elevation: No bolus between soft palate (SP)/pharyngeal wall (PW) Laryngeal elevation: Partial superior movement of thyroid  cartilage/partial approximation of arytenoids to epiglottic petiole Anterior hyoid excursion: Partial anterior movement Epiglottic movement: Partial inversion Laryngeal vestibule closure: Incomplete, narrow column air/contrast in laryngeal vestibule Pharyngeal stripping wave : Present - complete Pharyngeal contraction (A/P view only): N/A Pharyngoesophageal segment opening: Complete distension and complete duration, no obstruction of flow Tongue base retraction: No contrast between tongue base and posterior pharyngeal wall (PPW)  Pharyngeal residue: Trace residue within or on pharyngeal structures Location of pharyngeal residue: Pyriform sinuses  Esophageal Impairment Domain: Esophageal Impairment Domain Esophageal clearance upright position: Esophageal retention Pill: Pill Consistency administered: Mildly thick liquids (Level 2, nectar thick) Mildly thick liquids (Level 2, nectar thick): Impaired (see clinical impressions) Penetration/Aspiration Scale Score: Penetration/Aspiration Scale Score 1.  Material does not enter airway: Puree; Solid; Pill 8.  Material enters airway, passes BELOW cords without attempt by patient to eject out (silent aspiration) : Thin liquids (Level 0); Mildly thick liquids (Level 2, nectar thick) Compensatory Strategies: Compensatory Strategies Compensatory strategies: Yes Straw: Ineffective Ineffective Straw: Thin liquid (Level 0); Mildly thick liquid (Level 2, nectar thick) Chin tuck: Ineffective Ineffective Chin Tuck: Thin liquid (Level 0); Mildly thick liquid (Level 2, nectar thick)   General Information: Caregiver present: No  Diet Prior to this Study: NPO   Temperature : Normal   Respiratory Status: WFL   Supplemental O2: None (Room air)   History of Recent Intubation: No  Behavior/Cognition: Alert; Cooperative; Pleasant mood Self-Feeding Abilities: Dependent for feeding Baseline vocal quality/speech: Normal Volitional Cough: Able to elicit Volitional Swallow: Able to elicit Exam Limitations: No limitations Goal Planning: Prognosis for improved oropharyngeal function: Good No data recorded No data recorded Patient/Family Stated Goal: none stated Consulted and agree with results and recommendations: Patient Pain: Pain Assessment Pain Assessment: Faces Faces Pain Scale: 0 Pain Location: generalized Pain Descriptors / Indicators: Discomfort Pain Intervention(s): Monitored during session End of Session: Start Time:SLP Start Time (ACUTE ONLY): 1140 Stop Time: SLP Stop Time (ACUTE ONLY): 1157 Time Calculation:SLP  Time Calculation (min) (ACUTE ONLY): 17 min Charges:  SLP Evaluations $ SLP Speech Visit: 1 Visit SLP Evaluations $BSS Swallow: 1 Procedure $MBS Swallow: 1 Procedure $ SLP EVAL LANGUAGE/SOUND PRODUCTION: 1 Procedure SLP visit diagnosis: SLP Visit Diagnosis: Dysphagia, oropharyngeal phase (R13.12) Past Medical History: No past medical history on file. Past Surgical History: No past surgical history on file. Leita SQUIBB Nix 06/24/2024, 1:34 PM  MR ANGIO HEAD WO CONTRAST Result Date: 06/24/2024 EXAM: MR Angiography Head without intravenous Contrast. 06/24/2024 07:03:00 AM TECHNIQUE: Magnetic resonance angiography images of the head without intravenous contrast. Multiplanar 2D and 3D reformatted images are provided for review. 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 Abnormal CT angiography of the neck. SABRA FINDINGS: ANTERIOR CIRCULATION: The left A1 segment is dominant. Mild atherosclerotic changes are present within the cavernous internal carotid arteries bilaterally without significant stenoses. No aneurysm. POSTERIOR CIRCULATION: No significant stenosis of the posterior cerebral arteries. No significant stenosis of the basilar artery. No significant stenosis of the vertebral arteries. No aneurysm. IMPRESSION: 1. No significant stenosis of the intracranial vasculature. 2. Mild atherosclerotic changes within the cavernous internal carotid arteries bilaterally. Electronically signed by: Lonni Necessary MD 06/24/2024 08:23 AM EDT RP Workstation: [id number]   MR ANGIO NECK W WO CONTRAST Result Date: 06/24/2024 EXAM: MRA Neck without and with contrast 06/24/2024 07:05:00 AM TECHNIQUE: Multiplanar multisequence MRA of the neck was performed without and with the administration of 7 mL intravenous gadobutrol  (GADAVIST ) 1 MMOL/ML. 2D and 3D reformatted images are provided for review. Stenosis of the internal carotid arteries is  measured using NASCET criteria. 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 FINDINGS: CAROTID ARTERIES: Noncontrast time-of-flight images demonstrate signal loss in the proximal internal and external carotid arteries bilaterally with significant motion artifact at this level. Post contrast images demonstrate no significant stenosis or web. Some venous contamination is present. Moderate tortuosity is present in the mid cervical ICA bilaterally without significant stenosis. VERTEBRAL ARTERIES: Flow is antegrade in the vertebral arteries bilaterally. No focal stenosis is present. IMPRESSION: 1. No hemodynamically significant stenosis or dissection of the neck arteries. 2. Moderate tortuosity in the mid cervical ICA bilaterally without significant stenosis. Electronically signed by: Lonni Necessary MD 06/24/2024 08:21 AM EDT RP Workstation: [id number]   MR BRAIN WO CONTRAST Result Date: 06/24/2024 EXAM: MRI BRAIN WITHOUT CONTRAST 06/24/2024 07:03:47 AM TECHNIQUE: Multiplanar multisequence MRI of the head/brain was performed without the administration of intravenous contrast. COMPARISON: None available. CLINICAL HISTORY: Neuro deficit, acute, stroke suspected. 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 No acute supratentorial infarct is present. Mild T2 signal changes are associated with the acute infarct. Remote infarcts are present in the thalami, right greater than left. A remote lacunar infarct is present in the left caudate head. Remote lacunar infarcts are also present in the left corona radiata. Moderate atrophy and white matter disease are present. Dilated perivascular spaces are present throughout the basal ganglia bilaterally. Scattered foci of susceptibility are present throughout the basal ganglia and thalami bilaterally.  Other scattered foci are present in the parietal greater than frontal lobes. A focal area of susceptibility in the medial left temporal lobe measures 12 mm. No mass. No midline shift. No hydrocephalus. The sella is unremarkable. Normal flow voids. ORBITS: Bilateral lens replacements are noted. The globes and orbits are otherwise within normal limits. SINUSES AND MASTOIDS: No acute abnormality. BONES AND  SOFT TISSUES: Normal marrow signal. No acute soft tissue abnormality. IMPRESSION: 1. 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 No acute supratentorial infarct. 2. Remote infarcts in the thalami (right greater than left), left caudate head, and left corona radiata. 3. Moderate atrophy and white matter disease. Electronically signed by: Lonni Necessary MD 06/24/2024 08:18 AM EDT RP Workstation: [id number]   CT ANGIO HEAD NECK W WO CM (CODE STROKE) Result Date: 06/24/2024 EXAM: CTA HEAD AND NECK WITH AND WITHOUT 06/24/2024 03:51:49 AM TECHNIQUE: CTA of the head and neck was performed with and without the administration of intravenous contrast. Multiplanar 2D and/or 3D reformatted images are provided for review. Automated exposure control, iterative reconstruction, and/or weight based adjustment of the mA/kV was utilized to reduce the radiation dose to as low as reasonably achievable. Stenosis of the internal carotid arteries measured using NASCET criteria. COMPARISON: None available CLINICAL HISTORY: Neuro deficit, acute, stroke suspected. Lt side weakness, lt facial droop, slurred speech FINDINGS: CTA NECK: AORTIC ARCH AND ARCH VESSELS: Calcific aortic atherosclerosis. No dissection or arterial injury. No significant stenosis of the brachiocephalic or subclavian arteries. CERVICAL CAROTID ARTERIES: Bilaterally there are areas of decreased opacification within the proximal internal carotid arteriesy which may be due to turbulent  blood flow, though a non-occlusive atherosclerotic web could also have this appearance. The finding on the right is more convincing than that on the left. No hemodynamically significant stenosis of either internal carotid artery. Bilateral tortuous distal internal carotid arteries. CERVICAL VERTEBRAL ARTERIES: No dissection, arterial injury, or significant stenosis. LUNGS AND MEDIASTINUM: Unremarkable. SOFT TISSUES: No acute abnormality. BONES: No acute abnormality. CTA HEAD: ANTERIOR CIRCULATION: Atherosclerotic calcification of the cavernous segments of both internal carotid arteries without hemodynamically significant stenosis. No significant stenosis of the internal carotid arteries. No significant stenosis of the anterior cerebral arteries. No significant stenosis of the middle cerebral arteries. No aneurysm. POSTERIOR CIRCULATION: No significant stenosis of the posterior cerebral arteries. No significant stenosis of the basilar artery. No significant stenosis of the vertebral arteries. No aneurysm. OTHER: No dural venous sinus thrombosis on this non-dedicated study. IMPRESSION: 1. No large vessel occlusion, hemodynamically significant stenosis, or aneurysm in the head or neck. 2. Focal areas of decreased contrast enhancement in both proximal internal carotid arteries may be secondary to turbulent flow, though non-occlusive atherosclerotic webs or partially adherent thrombus may also have this appearance. The finding on the right is more convincing. 3. Case discussed with Dr. Vanessa at 4:00 am on 06/24/24. Electronically signed by: Franky Stanford MD 06/24/2024 04:05 AM EDT RP Workstation: [id number]   CT HEAD CODE STROKE WO CONTRAST Result Date: 06/24/2024 EXAM: CT HEAD WITHOUT CONTRAST 06/24/2024 03:45:22 AM TECHNIQUE: CT of the head was performed without the administration of intravenous contrast. Automated exposure control, iterative reconstruction, and/or weight based adjustment of the mA/kV was  utilized to reduce the radiation dose to as low as reasonably achievable. COMPARISON: 03/30/2014 CLINICAL HISTORY: Neuro deficit, acute, stroke suspected. Stroke, Lt side facial droop, slurred speech; Dr. Aron FINDINGS: BRAIN AND VENTRICLES: No acute hemorrhage. Gray-white differentiation is preserved. No hydrocephalus. No extra-axial collection. No mass effect or midline shift. Chronic ischemic white matter changes. 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 ASPECTS is 10. ORBITS: No acute abnormality. SINUSES: No acute abnormality. SOFT TISSUES AND SKULL: No acute soft tissue abnormality. No skull fracture. IMPRESSION: 1. No acute intracranial abnormality. ASPECTS is 10. 2. Chronic ischemic white matter changes and multiple small  vessel infarcts of the deep gray nuclei. 3. Findings communicated to Dr. Sal Khaliqdina at 3:49 am on 06/24/24. Electronically signed by: Franky Stanford MD 06/24/2024 03:50 AM EDT RP Workstation: [id number]   (Echo, Carotid, EGD, Colonoscopy, ERCP)    Subjective: No significant events overnight, he denies any complaints, patient is eager to go home as soon as possible  Discharge Exam: Vitals:   06/27/24 0451 06/27/24 0838  BP: (!) 155/91   Pulse: 82 91  Resp: 18 16  Temp: 97.8 F (36.6 C) 97.7 F (36.5 C)  SpO2: 94% 95%   Vitals:   06/26/24 2346 06/27/24 0400 06/27/24 0451 06/27/24 0838  BP: 138/89 (!) 146/82 (!) 155/91   Pulse: 68 69 82 91  Resp: 15 19 18 16   Temp: 97.6 F (36.4 C) 98.1 F (36.7 C) 97.8 F (36.6 C) 97.7 F (36.5 C)  TempSrc: Oral Oral Oral Oral  SpO2: 94% 95% 94% 95%  Weight:      Height:        General: Pt is alert, awake, not in acute distress, continues to have mild left-sided weakness Cardiovascular: RRR, S1/S2 +, no rubs, no gallops Respiratory: CTA bilaterally, no wheezing, no rhonchi Abdominal: Soft, NT, ND, bowel sounds + Extremities: no edema, no cyanosis    The results of significant diagnostics from this  hospitalization (including imaging, microbiology, ancillary and laboratory) are listed below for reference.     Microbiology: No results found for this or any previous visit (from the past 240 hours).   Labs: BNP (last 3 results) No results for input(s): BNP in the last 8760 hours. Basic Metabolic Panel: Recent Labs  Lab 06/24/24 0342 06/24/24 0347 06/24/24 0642 06/25/24 0436 06/26/24 0600  NA 138 140 139 138 138  K 3.3* 3.2* 3.8 3.3* 3.8  CL 102 104 102 108 104  CO2 23  --  25 25 24   GLUCOSE 136* 135* 149* 119* 118*  BUN 15 18 14 12 13   CREATININE 1.10 1.00 1.07 1.08 0.99  CALCIUM  9.1  --  9.1 8.6* 9.1   Liver Function Tests: Recent Labs  Lab 06/24/24 0342 06/24/24 0642  AST 17 18  ALT 10 11  ALKPHOS 59 60  BILITOT 0.4 0.6  PROT 8.0 8.1  ALBUMIN 3.4* 3.4*   No results for input(s): LIPASE, AMYLASE in the last 168 hours. No results for input(s): AMMONIA in the last 168 hours. CBC: Recent Labs  Lab 06/24/24 0342 06/24/24 0347 06/24/24 0642 06/25/24 0436 06/26/24 0600  WBC 10.0  --  9.9 8.0 9.4  NEUTROABS 5.9  --   --  5.4 6.5  HGB 14.2 15.6 13.9 12.9* 14.7  HCT 43.6 46.0 42.8 39.1 44.1  MCV 92.2  --  92.4 90.9 90.2  PLT 96*  --  89* 88* 101*   Cardiac Enzymes: No results for input(s): CKTOTAL, CKMB, CKMBINDEX, TROPONINI in the last 168 hours. BNP: Invalid input(s): POCBNP CBG: Recent Labs  Lab 06/24/24 0339  GLUCAP 140*   D-Dimer No results for input(s): DDIMER in the last 72 hours. Hgb A1c No results for input(s): HGBA1C in the last 72 hours. Lipid Profile No results for input(s): CHOL, HDL, LDLCALC, TRIG, CHOLHDL, LDLDIRECT in the last 72 hours. Thyroid  function studies No results for input(s): TSH, T4TOTAL, T3FREE, THYROIDAB in the last 72 hours.  Invalid input(s): FREET3 Anemia work up No results for input(s): VITAMINB12, FOLATE, FERRITIN, TIBC, IRON, RETICCTPCT in the last 72  hours. Urinalysis No results found for: COLORURINE, APPEARANCEUR,  LABSPEC, PHURINE, GLUCOSEU, HGBUR, BILIRUBINUR, KETONESUR, PROTEINUR, UROBILINOGEN, NITRITE, LEUKOCYTESUR Sepsis Labs Recent Labs  Lab 06/24/24 0342 06/24/24 0642 06/25/24 0436 06/26/24 0600  WBC 10.0 9.9 8.0 9.4   Microbiology No results found for this or any previous visit (from the past 240 hours).     SIGNED:   Brayton Lye, MD  [facility] 06/27/2024, 1:02 PM Pager   If 7PM-7AM, please contact night-coverage [email or web address]

## 2024-06-27 NOTE — Progress Notes (Signed)
 Speech Language Pathology Treatment: Dysphagia  Patient Details Name: RIYAAN HEROUX MRN: [id number] DOB: January 26, 1941 Today's Date: 06/27/2024 Time: 1225-1247 SLP Time Calculation (min) (ACUTE ONLY): 22 min  Assessment / Plan / Recommendation Clinical Impression  Pt seen for f/u dysphagia tx with upgraded consistencies of Dysphagia 3(soft solids)/nectar-thickened liquids with cup sips/tsp amounts provided with multiple swallows noted, impaired mastication and min change in vocal quality (slightly wet until repetitive swallow initiated), but swallow appeared timely and no anterior loss noted.  Buccal retention not observed with soft solid trial, but fatigue may be a factor with meals requiring lingual sweep on L.  Discussed plan of care and recommendations/precautions with wife/daughter and risk for aspiration without modified diet/swallow strategies implemented.  Recommend repeating MBS once pt has received HH ST to improve swallow function with trials of upgraded consistency/meals observed and dysphagia tx initiated.  Discussed improving vocal quality/dysarthria d/t noted decreased intelligibility (75% accurate d/t low vocal intensity, imprecise articulation), but dysphagia was primary focus of treatment session.  Imminent d/c planned per MD to home with [facility] S[facility] f/u for above mentioned deficits.  HPI HPI: 83 yo male presenting 8/17 with L sided weakness and slurred speech. 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 PMH includes: CAD, HTN, afib, RA. MRI also showed remote infarcts in the thalami (R>L), L caudate head, and L corona radiata. Esophagram in 2006 indicating reflux and hiatal hernia; MBS results 8/17 indicated need for Dysphagia 2/honey thick liquids.  ST f/u for dysphagia/dysarthria tx.      SLP Plan  Continue with current plan of care          Recommendations  Diet recommendations: Dysphagia 2 (fine chop);Honey-thick liquid Liquids provided via: Cup Medication  Administration: Crushed with puree Supervision: Staff to assist with self feeding;Full supervision/cueing for compensatory strategies Compensations: Slow rate;Small sips/bites;Lingual sweep for clearance of pocketing;Monitor for anterior loss Postural Changes and/or Swallow Maneuvers: Seated upright 90 degrees;Upright 30-60 min after meal                  Oral care QID;Staff/trained caregiver to provide oral care;Oral care prior to ice chip/H20   Frequent or constant Supervision/Assistance Dysphagia, oropharyngeal phase (R13.12)     Continue with current plan of care     Pat Lucan Riner,M.S.,CCC-SLP  06/27/2024, 12:58 PM

## 2024-06-27 NOTE — Progress Notes (Signed)
 Discharge instructions given to wife at bedside. Wife to provide transport home. Left in own wheelchair

## 2024-06-27 NOTE — Progress Notes (Signed)
 Physical Therapy Treatment Patient Details Name: HUTSON LUFT MRN: [id number] DOB: [birth year] Today's Date: 06/27/2024   History of Present Illness DRACO MALCZEWSKI is a 83 y.o. male who presented to [facility] ED 06/24/24 as a code stroke. 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 PMH: RA, CAD, HLD, HTN, afib, mitral regurgitation, aortic insufficiency    PT Comments  Pt tolerated treatment well today. Co-treat with OT. Session today focused on family education of transfer, gait belt, proper use of WC, and pressure relief. Pt today able to transfer from bed to [facility] and back to bed with +2 Min/Mod A via stand pivot and squat pivot transfer. DC recs updated to HHPT. Pt anticipates DC home today. PT will continue to follow.     If plan is discharge home, recommend the following: Two people to help with walking and/or transfers;A lot of help with bathing/dressing/bathroom;Assistance with cooking/housework;Assist for transportation;Help with stairs or ramp for entrance   Can travel by private vehicle        Equipment Recommendations  [place] lift;Wheelchair (measurements PT);Rolling walker (2 wheels);Wheelchair cushion (measurements PT);Hospital bed    Recommendations for Other Services       Precautions / Restrictions Precautions Precautions: Fall Recall of Precautions/Restrictions: Impaired Restrictions Weight Bearing Restrictions Per Provider Order: No     Mobility  Bed Mobility Overal bed mobility: Needs Assistance Bed Mobility: Supine to Sit     Supine to sit: Min assist     General bed mobility comments: HHA for trunk    Transfers Overall transfer level: Needs assistance Equipment used: 2 person hand held assist Transfers: Sit to/from Stand, Bed to chair/wheelchair/BSC Sit to Stand: Mod assist, +2 physical assistance, +2 safety/equipment (L knee block initially)   Step pivot transfers: Mod assist, +2  safety/equipment, +2 physical assistance Squat pivot transfers: Min assist (min A for guidance, cues for UE and foot placement/weight shift after initial education)     General transfer comment: provided family education regarding movement toward strong R side; worked on SPT, however, provided education and practiced squat pivot as this will be safest option for pt at home initally    Ambulation/Gait                   Stairs             Wheelchair Mobility     Tilt Bed    Modified Rankin (Stroke Patients Only)       Balance Overall balance assessment: Needs assistance Sitting-balance support: No upper extremity supported, Feet supported, Bilateral upper extremity supported Sitting balance-Leahy Scale: Fair Sitting balance - Comments: statically   Standing balance support: Bilateral upper extremity supported, During functional activity Standing balance-Leahy Scale: Poor                              Communication Communication Communication: Impaired Factors Affecting Communication: Reduced clarity of speech;Difficulty expressing self (improving)  Cognition Arousal: Alert Behavior During Therapy: Flat affect                             Following commands: Impaired Following commands impaired: Follows one step commands with increased time, Follows multi-step commands inconsistently    Cueing Cueing Techniques: Verbal cues, Gestural cues, Tactile cues  Exercises      General Comments General comments (skin integrity, edema, etc.): provided  family education regarding pressure relief from bed position, chair position; safety with transfers/recommendations for safer transfers with working on standing and walking with therapies; provision of asssist for LB ADL; wheelchair use and management      Pertinent Vitals/Pain Pain Assessment Pain Assessment: No/denies pain    Home Living                          Prior Function             PT Goals (current goals can now be found in the care plan section) Progress towards PT goals: Progressing toward goals    Frequency    Min 3X/week      PT Plan      Co-evaluation PT/OT/SLP Co-Evaluation/Treatment: Yes Reason for Co-Treatment: Complexity of the patient's impairments (multi-system involvement);For patient/therapist safety;To address functional/ADL transfers PT goals addressed during session: Mobility/safety with mobility OT goals addressed during session: ADL's and self-care;Proper use of Adaptive equipment and DME      AM-PAC PT 6 Clicks Mobility   Outcome Measure  Help needed turning from your back to your side while in a flat bed without using bedrails?: A Lot Help needed moving from lying on your back to sitting on the side of a flat bed without using bedrails?: A Lot Help needed moving to and from a bed to a chair (including a wheelchair)?: A Lot Help needed standing up from a chair using your arms (e.g., wheelchair or bedside chair)?: A Lot Help needed to walk in hospital room?: Total Help needed climbing 3-5 steps with a railing? : Total 6 Click Score: 10    End of Session Equipment Utilized During Treatment: Gait belt Activity Tolerance: Patient tolerated treatment well;Patient limited by fatigue Patient left: in bed;with call bell/phone within reach;with family/visitor present Nurse Communication: Mobility status PT Visit Diagnosis: Hemiplegia and hemiparesis;Difficulty in walking, not elsewhere classified (R26.2);Other abnormalities of gait and mobility (R26.89) Hemiplegia - Right/Left: Left Hemiplegia - dominant/non-dominant: Non-dominant Hemiplegia - caused by: Cerebral infarction     Time: 8853-8784 PT Time Calculation (min) (ACUTE ONLY): 29 min  Charges:    $Therapeutic Activity: 8-22 mins PT General Charges $$ ACUTE PT VISIT: 1 Visit                     Sueellen NOVAK, PT, DPT Acute Rehab Services [phone]    Shain Pauwels 06/27/2024, 3:56 PM

## 2024-06-27 NOTE — Discharge Instructions (Signed)
 Follow with Primary MD  Get CBC, CMP,  checked  by Primary MD next visit.    Activity: As tolerated with Full fall precautions use walker/cane & assistance as needed   Disposition Home    On your next visit with your primary care physician please Get Medicines reviewed and adjusted.   Please request your Prim.MD to go over all Hospital Tests and Procedure/Radiological results at the follow up, please get all Hospital records sent to your Prim MD by signing hospital release before you go home.   If you experience worsening of your admission symptoms, develop shortness of breath, life threatening emergency, suicidal or homicidal thoughts you must seek medical attention immediately by calling 911 or calling your MD immediately  if symptoms less severe.  You Must read complete instructions/literature along with all the possible adverse reactions/side effects for all the Medicines you take and that have been prescribed to you. Take any new Medicines after you have completely understood and accpet all the possible adverse reactions/side effects.   Do not drive, operating heavy machinery, perform activities at heights, swimming or participation in water activities or provide baby sitting services if your were admitted for syncope or siezures until you have seen by Primary MD or a Neurologist and advised to do so again.  Do not drive when taking Pain medications.    Do not take more than prescribed Pain, Sleep and Anxiety Medications  Special Instructions: If you have smoked or chewed Tobacco  in the last 2 yrs please stop smoking, stop any regular Alcohol  and or any Recreational drug use.  Wear Seat belts while driving.   Please note  You were cared for by a hospitalist during your hospital stay. If you have any questions about your discharge medications or the care you received while you were in the hospital after you are discharged, you can call the unit and asked to speak with the  hospitalist on call if the hospitalist that took care of you is not available. Once you are discharged, your primary care physician will handle any further medical issues. Please note that NO REFILLS for any discharge medications will be authorized once you are discharged, as it is imperative that you return to your primary care physician (or establish a relationship with a primary care physician if you do not have one) for your aftercare needs so that they can reassess your need for medications and monitor your lab values.

## 2024-06-27 NOTE — TOC Transition Note (Signed)
 Transition of Care [facility]) - Discharge Note   Patient Details  Name: Jackson Lawson MRN: [id number] Date of Birth: 01-10-1941  [facility] of [facility]) CM/SW Contact:  Marval Gell, RN Phone Number: 06/27/2024, 9:28 AM   Clinical Narrative:     [name] that patient will DC today and will need PT OT SLP.     Barriers to Discharge: Continued Medical Work up   Patient Goals and CMS Choice Patient states their goals for this hospitalization and ongoing recovery are:: To go home          Discharge Placement                       Discharge Plan and Services Additional resources added to the After Visit Summary for   In-house Referral: Clinical Social Work Discharge Planning Services: CM Consult            DME Arranged: Community education officer wheelchair with seat cushion DME Agency: [employer] Date DME Agency Contacted: 06/26/24 Time DME Agency Contacted: [phone] Representative spoke with at DME Agency: [place] CHEADLE Agency: [facility] Date [facility] Agency Contacted: 06/26/24 Time HH Agency Contacted: [phone] Representative spoke with at [facility] Agency: Amy  Social Drivers of Health (SDOH) Interventions SDOH Screenings   Food Insecurity: No Food Insecurity (06/20/2024)   Received from [facility]  Transportation Needs: No Transportation Needs (06/20/2024)   Received from [facility]  Utilities: Low Risk  (06/20/2024)   Received from [facility]  Financial Resource Strain: Low Risk  (06/21/2023)   Received from [facility]  Physical Activity: Inactive (06/21/2023)   Received from [facility]  Social Connections: Moderately Isolated (06/21/2023)   Received from [facility]  Stress: No Stress Concern Present (06/21/2023)   Received from [facility]  Tobacco Use: Low Risk  (06/24/2024)  Health Literacy: Low Risk  (06/21/2023)   Received from [facility]     Readmission Risk Interventions     No data to display

## 2024-06-27 NOTE — Plan of Care (Signed)
  Problem: Education: Goal: Knowledge of disease or condition will improve 06/27/2024 0600 by Saintclair Ratel, RN Outcome: Progressing 06/27/2024 0432 by Saintclair Ratel, RN Outcome: Progressing Goal: Knowledge of secondary prevention will improve (MUST DOCUMENT ALL) Outcome: Progressing   Problem: Ischemic Stroke/TIA Tissue Perfusion: Goal: Complications of ischemic stroke/TIA will be minimized 06/27/2024 0600 by Saintclair Ratel, RN Outcome: Progressing 06/27/2024 0432 by Saintclair Ratel, RN Outcome: Progressing   Problem: Coping: Goal: Will verbalize positive feelings about self 06/27/2024 0600 by Saintclair Ratel, RN Outcome: Progressing 06/27/2024 0432 by Saintclair Ratel, RN Outcome: Progressing Goal: Will identify appropriate support needs 06/27/2024 0600 by Saintclair Ratel, RN Outcome: Progressing 06/27/2024 0432 by Saintclair Ratel, RN Outcome: Progressing   Problem: Health Behavior/Discharge Planning: Goal: Ability to manage health-related needs will improve Outcome: Progressing   Problem: Self-Care: Goal: Ability to participate in self-care as condition permits will improve Outcome: Progressing Goal: Verbalization of feelings and concerns over difficulty with self-care will improve 06/27/2024 0600 by Saintclair Ratel, RN Outcome: Progressing 06/27/2024 0432 by Saintclair Ratel, RN Outcome: Progressing Goal: Ability to communicate needs accurately will improve 06/27/2024 0600 by Saintclair Ratel, RN Outcome: Progressing 06/27/2024 0432 by Saintclair Ratel, RN Outcome: Progressing   Problem: Education: Goal: Knowledge of General Education information will improve Description: Including pain rating scale, medication(s)/side effects and non-pharmacologic comfort measures Outcome: Progressing

## 2024-07-24 NOTE — Progress Notes (Unsigned)
 [facility] [address] [place]. [place] [address] [phone]       HOSPITAL FOLLOW UP NOTE  Mr. Jackson Lawson Date of Birth:  February 28, 1941 Medical Record Number:  [id number]   Reason for Referral:  hospital stroke follow up    SUBJECTIVE:   CHIEF COMPLAINT:  No chief complaint on file.   HPI:   Mr. Jackson Lawson is a 83 y.o. male with history of CAD, HTN, afib on Eliquis , and RA who presented to [facility] ED on 06/24/2024 with left-sided weakness.  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  MRI also showed remote infarcts in the thalami, left caudate head and left corona radiata.  CTA head/neck negative LVO.  LDL 88, A1c 6.0.  On Eliquis  PTA for atrial fibrillation and added aspirin  81 mg daily as well as Crestor  10 mg daily for further stroke prevention.  Therapies recommended CIR for ongoing therapy needs although patient declined and adamant about returning home.  He was discharged home with home health therapies and maximum home support        PERTINENT IMAGING  CT head no acute intracranial abnormality.  Aspects is 10.  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 CTA head & neck no LVO or hemodynamically significant stenosis.  Focal areas of decreased contrast enhancement in both proximal internal carotid arteries may be secondary to turbulent flow, though nonocclusive atherosclerotic webs or partially adherent thrombus may also have this appearance.  The finding is more convincing on the right.  MRI acute/subacute nonhemorrhagic right paramedian pontine infarct measuring 8 x 13 mm with associated mild T2 signal changes.  No acute supratentorial infarct.  Remote infarcts in the thalami (right greater than left), left caudate head, and left corona radiata.  Moderate atrophy and white matter disease. MRA head no significant stenosis of the intracranial  vasculature.  Mild atherosclerotic changes within the cavernous internal carotid arteries bilaterally. MRA neck no hemodynamically significant stenosis or dissection of the neck arteries.  Moderate tortuosity in the mid cervical ICA bilaterally without significant stenosis. 2D Echo pending LDL 80 HgbA1c 6.0    ROS:   14 system review of systems performed and negative with exception of ***  PMH: No past medical history on file.  PSH: No past surgical history on file.  Social History:  Social History   Socioeconomic History   Marital status: Married    Spouse name: Not on file   Number of children: Not on file   Years of education: Not on file   Highest education level: Not on file  Occupational History   Not on file  Tobacco Use   Smoking status: Never   Smokeless tobacco: Never  Substance and Sexual Activity   Alcohol use: Not on file   Drug use: Not on file   Sexual activity: Not on file  Other Topics Concern   Not on file  Social History Narrative   Not on file   Social Drivers of Health   Financial Resource Strain: Low Risk  (06/21/2023)   Received from [facility]   Overall Financial Resource Strain (CARDIA)    Difficulty of Paying Living Expenses: Not hard at all  Food Insecurity: No Food Insecurity (06/27/2024)   Hunger Vital Sign    Worried About Running Out of Food in the Last Year: Never true    Ran Out of Food in the Last Year: Never  true  Transportation Needs: No Transportation Needs (06/27/2024)   PRAPARE - Administrator, Civil Service (Medical): No    Lack of Transportation (Non-Medical): No  Physical Activity: Inactive (06/21/2023)   Received from [facility]   Exercise Vital Sign    On average, how many days per week do you engage in moderate to strenuous exercise (like a brisk walk)?: 0 days    On average, how many minutes do you engage in exercise at this level?: 0 min  Stress: No Stress Concern Present (06/21/2023)   Received  from [facility] - Occupational Stress Questionnaire    Feeling of Stress : Not at all  Social Connections: Moderately Isolated (06/27/2024)   Social Connection and Isolation Panel    Frequency of Communication with Friends and Family: Never    Frequency of Social Gatherings with Friends and Family: Never    Attends Religious Services: Never    Database administrator or Organizations: Yes    Attends Engineer, structural: More than 4 times per year    Marital Status: Married  Catering manager Violence: Not At Risk (06/27/2024)   Humiliation, Afraid, Rape, and Kick questionnaire    Fear of Current or Ex-Partner: No    Emotionally Abused: No    Physically Abused: No    Sexually Abused: No    Family History: No family history on file.  Medications:   Current Outpatient Medications on File Prior to Visit  Medication Sig Dispense Refill   acetaminophen  (TYLENOL ) 325 MG tablet Take 2 tablets (650 mg total) by mouth every 6 (six) hours as needed for mild pain (pain score 1-3).     amLODipine (NORVASC) 10 MG tablet Take 10 mg by mouth daily.     apixaban  (ELIQUIS ) 5 MG TABS tablet Take 5 mg by mouth 2 (two) times daily.     aspirin  EC 81 MG tablet Take 1 tablet (81 mg total) by mouth daily. Swallow whole. 30 tablet 0   FLUoxetine  (PROZAC ) 20 MG capsule Take 20 mg by mouth daily.     Multiple Vitamin (MULTI-VITAMIN) tablet Take 1 tablet by mouth daily.     nitroGLYCERIN  (NITROSTAT ) 0.4 MG SL tablet Place 0.4 mg under the tongue every 5 (five) minutes as needed for chest pain. (Patient not taking: Reported on 06/24/2024)     omeprazole (PRILOSEC) 40 MG capsule Take 40 mg by mouth daily.     rosuvastatin  (CRESTOR ) 10 MG tablet Take 1 tablet (10 mg total) by mouth daily. 30 tablet 0   No current facility-administered medications on file prior to visit.    Allergies:   Allergies  Allergen Reactions   Fenofibrate Diarrhea   Simvastatin  Diarrhea      OBJECTIVE:  Physical Exam  There were no vitals filed for this visit. There is no height or weight on file to calculate BMI. No results found.   General: well developed, well nourished, seated, in no evident distress Head: head normocephalic and atraumatic.   Neck: supple with no carotid or supraclavicular bruits Cardiovascular: regular rate and rhythm, no murmurs Musculoskeletal: no deformity Skin:  no rash/petichiae Vascular:  Normal pulses all extremities   Neurologic Exam Mental Status: Awake and fully alert. Oriented to place and time. Recent and remote memory intact. Attention span, concentration and fund of knowledge appropriate. Mood and affect appropriate.  Cranial Nerves: Fundoscopic exam reveals sharp disc margins. Pupils equal, briskly reactive to  light. Extraocular movements full without nystagmus. Visual fields full to confrontation. Hearing intact. Facial sensation intact. Face, tongue, palate moves normally and symmetrically.  Motor: Normal bulk and tone. Normal strength in all tested extremity muscles Sensory.: intact to touch , pinprick , position and vibratory sensation.  Coordination: Rapid alternating movements normal in all extremities. Finger-to-nose and heel-to-shin performed accurately bilaterally. Gait and Station: Arises from chair without difficulty. Stance is normal. Gait demonstrates normal stride length and balance with ***. Tandem walk and heel toe ***.  Reflexes: 1+ and symmetric. Toes downgoing.     NIHSS  *** Modified Rankin  ***      ASSESSMENT: Jackson Lawson is a 83 y.o. year old male with acute/subacute right pontine stroke on 06/24/2024 likely secondary to small vessel disease. Vascular risk factors include HTN, HLD, A-fib on Eliquis  CAD s/p MI 1995, prior strokes on imaging and advanced age.      PLAN:  Right pontine stroke:  Residual deficit: ***.  Continue aspirin  81mg  daily and Eliquis  5mg  twice daily and  rosuvastatin  (Crestor ) for secondary stroke prevention managed/prescribed by PCP.   Discussed secondary stroke prevention measures and importance of close PCP follow up for aggressive stroke risk factor management including BP goal<130/90, HLD with LDL goal<70 and DM with A1c.<7 .  Stroke labs 06/2024: LDL 80, A1c 6.0 I have gone over the pathophysiology of stroke, warning signs and symptoms, risk factors and their management in some detail with instructions to go to the closest emergency room for symptoms of concern.     Follow up in *** or call earlier if needed   CC:  [facility] provider: Dr. Rosemarie PCP: Pcp, No    I personally spent a total of *** minutes in the care of the patient today including {Time Based Coding:210964241}.    Harlene Bogaert, AGNP-BC  [facility] [address] [place], [place] [address]  Phone [phone] Fax [phone] Note: This document was prepared with digital dictation and possible smart phrase technology. Any transcriptional errors that result from this process are unintentional.

## 2024-07-25 ENCOUNTER — Ambulatory Visit: Admitting: Adult Health

## 2024-07-25 ENCOUNTER — Encounter: Payer: Self-pay | Admitting: Adult Health

## 2024-07-25 VITALS — BP 139/84 | HR 78 | Ht 74.0 in

## 2024-07-25 DIAGNOSIS — I69354 Hemiplegia and hemiparesis following cerebral infarction affecting left non-dominant side: Secondary | ICD-10-CM

## 2024-07-25 DIAGNOSIS — I6350 Cerebral infarction due to unspecified occlusion or stenosis of unspecified cerebral artery: Secondary | ICD-10-CM

## 2024-07-25 DIAGNOSIS — R2242 Localized swelling, mass and lump, left lower limb: Secondary | ICD-10-CM

## 2024-07-25 NOTE — Patient Instructions (Signed)
 Continue to work with therapies at home   We will check lab work today and will send these to your PCP tomorrow  Continue Eliquis , aspirin  and Crestor  for secondary stroke prevention  Continue to follow up with PCP regarding blood pressure and cholesterol management  Maintain strict control of hypertension with blood pressure goal below 130/90 and cholesterol with LDL cholesterol (bad cholesterol) goal below 70 mg/dL.   Signs of a Stroke? Follow the BEFAST method:  Balance Watch for a sudden loss of balance, trouble with coordination or vertigo Eyes Is there a sudden loss of vision in one or both eyes? Or double vision?  Face: Ask the person to smile. Does one side of the face droop or is it numb?  Arms: Ask the person to raise both arms. Does one arm drift downward? Is there weakness or numbness of a leg? Speech: Ask the person to repeat a simple phrase. Does the speech sound slurred/strange? Is the person confused ? Time: If you observe any of these signs, call [phone].         Thank you for coming to see us  at [facility]. I hope we have been able to provide you high quality care today.  You may receive a patient satisfaction survey over the next few weeks. We would appreciate your feedback and comments so that we may continue to improve ourselves and the health of our patients.

## 2024-07-26 ENCOUNTER — Ambulatory Visit: Payer: Self-pay | Admitting: Adult Health

## 2024-07-26 LAB — CBC WITH DIFFERENTIAL/PLATELET

## 2024-07-27 LAB — COMPREHENSIVE METABOLIC PANEL WITH GFR
ALT: 12 IU/L (ref 0–44)
AST: 17 IU/L (ref 0–40)
Albumin: 4.3 g/dL (ref 3.7–4.7)
Alkaline Phosphatase: 80 IU/L (ref 48–129)
BUN/Creatinine Ratio: 20 (ref 10–24)
BUN: 19 mg/dL (ref 8–27)
Bilirubin Total: 0.7 mg/dL (ref 0.0–1.2)
CO2: 22 mmol/L (ref 20–29)
Calcium: 9.6 mg/dL (ref 8.6–10.2)
Chloride: 102 mmol/L (ref 96–106)
Creatinine, Ser: 0.97 mg/dL (ref 0.76–1.27)
Globulin, Total: 3.8 g/dL (ref 1.5–4.5)
Glucose: 99 mg/dL (ref 70–99)
Potassium: 4.3 mmol/L (ref 3.5–5.2)
Sodium: 141 mmol/L (ref 134–144)
Total Protein: 8.1 g/dL (ref 6.0–8.5)
eGFR: 77 mL/min/1.73 (ref >59)

## 2024-07-27 LAB — CBC WITH DIFFERENTIAL/PLATELET
Basos: 1 %
EOS (ABSOLUTE): 0 x10E3/uL (ref 0.0–0.2)
Eos: 5 %
Hematocrit: 44.3 % (ref 37.5–51.0)
Hemoglobin: 14.2 g/dL (ref 13.0–17.7)
Immature Granulocytes: 0 %
Immature Granulocytes: 0 x10E3/uL (ref 0.0–0.1)
Lymphs: 19 %
MCH: 30.3 pg (ref 26.6–33.0)
MCHC: 32.1 g/dL (ref 31.5–35.7)
MCV: 95 fL (ref 79–97)
Monocytes Absolute: 0.3 x10E3/uL (ref 0.0–0.4)
Monocytes Absolute: 0.6 x10E3/uL (ref 0.1–0.9)
Monocytes: 9 %
Neutrophils Absolute: 1.2 x10E3/uL (ref 0.7–3.1)
Neutrophils Absolute: 4.3 x10E3/uL (ref 1.4–7.0)
Neutrophils: 66 %
Platelets: 99 x10E3/uL — AB (ref 150–450)
RBC: 4.69 x10E6/uL (ref 4.14–5.80)
RDW: 13.6 % (ref 11.6–15.4)
WBC: 6.5 x10E3/uL (ref 3.4–10.8)

## 2024-07-27 LAB — D-DIMER, QUANTITATIVE: D-DIMER: 1.87 mg{FEU}/L — ABNORMAL HIGH (ref 0.00–0.49)

## 2024-08-01 NOTE — Telephone Encounter (Signed)
 PHONE STAFF CAN RELAY IF PT RETURNS CALL.   Contacted pt/spouse, no answer, line kept ringing.  Please advise patient/family that labs showed no concern/signs of infection. 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 Please again re-educate sign/symptoms that should prompt an ED evaluation including worsening leg symptoms, shortness of breath, chest pain, cough and elevated heart rate. Thank you.   Pt seen PCP 9/18

## 2024-08-09 NOTE — Progress Notes (Signed)
 I agree with the above plan

## 2024-09-11 DIAGNOSIS — I4510 Unspecified right bundle-branch block: Secondary | ICD-10-CM | POA: Diagnosis not present

## 2024-09-11 DIAGNOSIS — I4891 Unspecified atrial fibrillation: Secondary | ICD-10-CM | POA: Diagnosis not present

## 2024-11-08 DEATH — deceased
